# Patient Record
Sex: Female | Born: 1980 | Race: Black or African American | Hispanic: No | Smoking: Never smoker
Health system: Southern US, Community
[De-identification: ages and names within clinical notes are randomized; demographics above are authoritative.]

## PROBLEM LIST (undated history)

## (undated) DIAGNOSIS — E58 Dietary calcium deficiency: Secondary | ICD-10-CM

## (undated) DIAGNOSIS — D649 Anemia, unspecified: Secondary | ICD-10-CM

## (undated) HISTORY — PX: BARIATRIC SURGERY: SHX1103

## (undated) HISTORY — DX: Anemia, unspecified: D64.9

## (undated) HISTORY — DX: Dietary calcium deficiency: E58

---

## 2017-01-09 ENCOUNTER — Encounter: Payer: Self-pay | Admitting: Family Medicine

## 2017-01-09 ENCOUNTER — Ambulatory Visit (INDEPENDENT_AMBULATORY_CARE_PROVIDER_SITE_OTHER): Payer: BC Managed Care – PPO | Admitting: Family Medicine

## 2017-01-09 VITALS — BP 106/64 | HR 84 | Temp 98.7°F | Ht 63.0 in | Wt 137.0 lb

## 2017-01-09 DIAGNOSIS — Z9884 Bariatric surgery status: Secondary | ICD-10-CM

## 2017-01-09 DIAGNOSIS — Z862 Personal history of diseases of the blood and blood-forming organs and certain disorders involving the immune mechanism: Secondary | ICD-10-CM

## 2017-01-09 DIAGNOSIS — D509 Iron deficiency anemia, unspecified: Secondary | ICD-10-CM

## 2017-01-09 DIAGNOSIS — Z9889 Other specified postprocedural states: Secondary | ICD-10-CM | POA: Diagnosis not present

## 2017-01-09 DIAGNOSIS — Z114 Encounter for screening for human immunodeficiency virus [HIV]: Secondary | ICD-10-CM

## 2017-01-09 NOTE — Progress Notes (Signed)
   Subjective:   Patient ID: Leah Solis    DOB: [birth year], 36 y.o. female   MRN: [id number]  CC: new patient visit   HPI: Leah Solis is a 36 y.o. female who presents to clinic today for establishing care with PCP.  Patient moved here from [place], [place] in January 2018.  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  Also works full time at [employer] in [place].  Attends school in [employer] Studies criminal justice.  Enjoys work at [employer] because gets to work with patients who have psych illnesses and she feels that is similar to working with Engineer, structural.  Divorced x 3 years.  Has three children, all girls - aged 13, 17, 5.    History of anemia Patient reports iron was low previously and she was taking iron pills.  Does not take them anymore because they constipate her.  Takes them when she feels tired. Has been feeling more tired lately and isn't sure if this is because of her iron.    Hx of gastric bypass Bypass in 2015 [place], [place]) by GI surgeon.  Has not gained any of her weight back.  Weight 268>137 lbs.  Eats several small meals a day.  Electrolytes were stable previously but she is concerned today whether electrolytes have remained normal.   Surgical Hx: C-section x1 in 2000, gastric bypass in 2015, GB removal 2015.  All teeth removed, now has dentures.  Meds: none, sometimes Iron and MV but the MV she is supposed to be taking after bypass is expensive   FHx: aunt has DM, mom with HLD and HTN, depression, ?breast ca Social: Never smoker, occasional THC when younger, socially etoh before but after gastric bypass not as much.  Lives by herself, daughters are adults and have moved out of the house.   Sexually active with bf.  No recent history of STDs, long time ago had BV and trichomonas in 2006/2007, treated.   ROS: Denies fevers, chills, nausea, vomiting, diarrhea.   Denies abdominal pain, shortness of breath.  Smoking status  reviewed. Patient is a never smoker.  Medications reviewed. Objective:   BP 106/64   Pulse 84   Temp 98.7 F (37.1 C) (Oral)   Ht  (1.6 m)   Wt 137 lb (62.1 kg)   LMP 01/02/2017   SpO2 99%   BMI 24.27 kg/m  Vitals and nursing note reviewed.  General: 37 yo F, well nourished, in no acute distress HEENT: NCAT, MMM, o/p clear  Neck: supple, nontender CV: RRR no MRG  Lungs: CTA B/L  Abdomen: soft, non-tender,+bs Skin: warm, dry Extremities: warm and well perfused, normal tone  Assessment & Plan:   History of iron deficiency anemia Per patient report, history of iron deficiency anemia.  Not currently taking iron as it constipates her.  Reports feeling tired lately.  -Check CBC, iron and TIBC today  HM: Pap in Oct 2017 - normal  -HIV screen today  Orders Placed This Encounter  Procedures  . Basic metabolic panel  . CBC  . Iron and TIBC  . HIV antibody    Freddrick March, MD [facility], PGY-1 01/14/2017 9:44 PM

## 2017-01-09 NOTE — Patient Instructions (Signed)
It was really nice meeting you today! You were seen in clinic to establish care with me as your PCP.  We reviewed your medical history and medications as well as addressed some health maintenance.  I obtained some bloodwork today to check your electrolytes and iron levels as you have been feeling tired lately.  In addition, I ordered an HIV screen as part of your health maintenance.   You can call clinic with any questions and follow up with me as needed.  I will call you once I have the results of your bloodwork.   Be well,  Freddrick March, MD

## 2017-01-10 LAB — BASIC METABOLIC PANEL
BUN/Creatinine Ratio: 15 (ref 9–23)
BUN: 9 mg/dL (ref 6–20)
CALCIUM: 8.7 mg/dL (ref 8.7–10.2)
CHLORIDE: 101 mmol/L (ref 96–106)
CO2: 26 mmol/L (ref 18–29)
CREATININE: 0.62 mg/dL (ref 0.57–1.00)
GFR calc Af Amer: 133 mL/min/{1.73_m2} (ref >59)
GFR calc non Af Amer: 116 mL/min/{1.73_m2} (ref >59)
Glucose: 75 mg/dL (ref 65–99)
POTASSIUM: 3.9 mmol/L (ref 3.5–5.2)
Sodium: 141 mmol/L (ref 134–144)

## 2017-01-10 LAB — CBC
HEMOGLOBIN: 7.4 g/dL — AB (ref 11.1–15.9)
Hematocrit: 25.7 % — ABNORMAL LOW (ref 34.0–46.6)
MCH: 17.5 pg — ABNORMAL LOW (ref 26.6–33.0)
MCHC: 28.8 g/dL — ABNORMAL LOW (ref 31.5–35.7)
MCV: 61 fL — ABNORMAL LOW (ref 79–97)
Platelets: 506 10*3/uL — ABNORMAL HIGH (ref 150–379)
RBC: 4.23 x10E6/uL (ref 3.77–5.28)
RDW: 18.7 % — ABNORMAL HIGH (ref 12.3–15.4)
WBC: 6.7 10*3/uL (ref 3.4–10.8)

## 2017-01-10 LAB — IRON AND TIBC
IRON SATURATION: 2 % — AB (ref 15–55)
Iron: 10 ug/dL — ABNORMAL LOW (ref 27–159)
TIBC: 426 ug/dL (ref 250–450)
UIBC: 416 ug/dL (ref 131–425)

## 2017-01-10 LAB — HIV ANTIBODY (ROUTINE TESTING W REFLEX): HIV SCREEN 4TH GENERATION: NONREACTIVE

## 2017-01-14 DIAGNOSIS — Z862 Personal history of diseases of the blood and blood-forming organs and certain disorders involving the immune mechanism: Secondary | ICD-10-CM | POA: Insufficient documentation

## 2017-01-14 DIAGNOSIS — Z9884 Bariatric surgery status: Secondary | ICD-10-CM | POA: Insufficient documentation

## 2017-01-14 NOTE — Assessment & Plan Note (Signed)
Per patient report, history of iron deficiency anemia.  Not currently taking iron as it constipates her.  Reports feeling tired lately.  -Check CBC, iron and TIBC today

## 2017-01-16 ENCOUNTER — Telehealth: Payer: Self-pay | Admitting: Family Medicine

## 2017-01-16 ENCOUNTER — Other Ambulatory Visit: Payer: Self-pay | Admitting: Family Medicine

## 2017-01-16 MED ORDER — DOCUSATE SODIUM 100 MG PO CAPS: 100.0000 mg | ORAL_CAPSULE | Freq: Every day | ORAL | 2 refills | Status: DC

## 2017-01-16 MED ORDER — FERROUS GLUCONATE 324 (38 FE) MG PO TABS: 324.0000 mg | ORAL_TABLET | Freq: Two times a day (BID) | ORAL | 2 refills | Status: DC

## 2017-01-16 NOTE — Telephone Encounter (Signed)
Discussed low iron sat with patient and recommended starting iron supplement.  Have called in ferrous gluconate to her pharmacy to be taken BID with meals.  Also prescribed Colace prn for constipation as she has previously not been taking iron due to this problem.  All questions answered.

## 2017-05-08 ENCOUNTER — Encounter: Payer: Self-pay | Admitting: Family Medicine

## 2017-05-08 ENCOUNTER — Ambulatory Visit (INDEPENDENT_AMBULATORY_CARE_PROVIDER_SITE_OTHER): Payer: BC Managed Care – PPO | Admitting: Family Medicine

## 2017-05-08 VITALS — BP 100/64 | HR 70 | Temp 98.3°F | Wt 136.0 lb

## 2017-05-08 DIAGNOSIS — F419 Anxiety disorder, unspecified: Secondary | ICD-10-CM | POA: Diagnosis not present

## 2017-05-08 MED ORDER — HYDROXYZINE HCL 25 MG PO TABS: 25.0000 mg | ORAL_TABLET | Freq: Three times a day (TID) | ORAL | 0 refills | Status: DC | PRN

## 2017-05-08 NOTE — Patient Instructions (Signed)
It was great seeing you today! We have addressed the following issues today  1. Starting on a medication called Atarax start at 25 mg and see how you tolerate the sedating effect. You can increase to 50 mg (2 pills) if you feel that it is not helping as long as you are not too drowsy 2. Use relaxation technique to help with your anxiety attack. 3. Follow up with PCP if there is no change  If we did any lab work today, and the results require attention, either me or my nurse will get in touch with you. If everything is normal, you will get a letter in mail and a message via . If you don't hear from us in two weeks, please give us a call. Otherwise, we look forward to seeing you again at your next visit. If you have any questions or concerns before then, please call the clinic at [phone].  Please bring all your medications to every doctors visit  Sign up for My Chart to have easy access to your labs results, and communication with your Primary care physician. Please ask Front Desk for some assistance.   Please check-out at the front desk before leaving the clinic.    Take Care,   Dr. Sydnee Cabaliallo

## 2017-05-08 NOTE — Progress Notes (Signed)
   Subjective:    Patient ID: Leah Solis, female    DOB: Jun 22, 1981, 36 y.o.   MRN: [id number]   CC: Anxiety  HPI: Patient isDollene Solis a 36 yo female with no significant past medical history who presents today complaining of anxiety. Patient reports she has had two episodes in the past month. Patient works at [employer] in [place], [place] and reports that job is very stressful in addition to being in school for [employer] justice. Patient reports palpitations, diaphoresis and mild tremors during anxiety attack. She had to take a few minutes to calm herself down but then felt better. Patient reports similar anxiety episodes two years ago and was briefly on Xanax with complete resolution of her symptoms after a few episodes. Patient denies any chest pain, shortness of breath, abdominal pain, nausea, vomiting, headaches, dizziness or vision changes.   Smoking status reviewed   ROS: all other systems were reviewed and are negative other than in the HPI   No past medical history on file.  No past surgical history on file.  Past medical history, surgical, family, and social history reviewed and updated in the EMR as appropriate.  Objective:  BP 100/64   Pulse 70   Temp 98.3 F (36.8 C) (Oral)   Wt 136 lb (61.7 kg)   LMP 04/21/2017   SpO2 99%   BMI 24.09 kg/m   Vitals and nursing note reviewed  General: NAD, pleasant, able to participate in exam Cardiac: RRR, normal heart sounds, no murmurs. 2+ radial and PT pulses bilaterally Respiratory: CTAB, normal effort, No wheezes, rales or rhonchi Abdomen: soft, nontender, nondistended, no hepatic or splenomegaly, +BS Extremities: no edema or cyanosis. WWP. Skin: warm and dry, no rashes noted Neuro: alert and oriented x4, no focal deficits Psych: Normal affect and mood   Assessment & Plan:   #Anxiety attack, acute, prior history Patient presented with symptoms suspicious for anxiety/panick attack. GAD score of 12. Patient able to  control symptoms with deep breathing and relaxation technique. Patient has a stressful job in addition to a busy social life which has been weighing on her. Patient with similar episodes two years ago and was started on xanax. Patient is not requesting any benzo but would like any agent that she could use to help her manage any future attack. --Order Atarax 25 mg tid as needed, discuss sedation and titrating dosage to 50 mg as needed/tolerated. --Relaxation technique handout given --Follow up with PCP if symptoms do not improve or worsens with current treatment plan. Would consider CBT and behavorial health referral in the future as needed.   Lovena NeighboursAbdoulaye Honestee Revard, MD [facility] PGY-2

## 2017-07-05 ENCOUNTER — Encounter: Payer: Self-pay | Admitting: Family Medicine

## 2017-07-05 ENCOUNTER — Ambulatory Visit (INDEPENDENT_AMBULATORY_CARE_PROVIDER_SITE_OTHER): Payer: BC Managed Care – PPO | Admitting: Family Medicine

## 2017-07-05 VITALS — BP 100/68 | HR 73 | Temp 98.2°F | Ht 63.0 in | Wt 134.0 lb

## 2017-07-05 DIAGNOSIS — R300 Dysuria: Secondary | ICD-10-CM | POA: Diagnosis not present

## 2017-07-05 DIAGNOSIS — D509 Iron deficiency anemia, unspecified: Secondary | ICD-10-CM | POA: Diagnosis not present

## 2017-07-05 DIAGNOSIS — Z862 Personal history of diseases of the blood and blood-forming organs and certain disorders involving the immune mechanism: Secondary | ICD-10-CM

## 2017-07-05 DIAGNOSIS — R8290 Unspecified abnormal findings in urine: Secondary | ICD-10-CM

## 2017-07-05 LAB — POCT URINALYSIS DIP (MANUAL ENTRY)
BILIRUBIN UA: NEGATIVE
Blood, UA: NEGATIVE
Glucose, UA: NEGATIVE mg/dL
Ketones, POC UA: NEGATIVE mg/dL
Nitrite, UA: NEGATIVE
Protein Ur, POC: NEGATIVE mg/dL
Spec Grav, UA: >=1.03 — AB (ref 1.010–1.025)
UROBILINOGEN UA: 0.2 U/dL
pH, UA: 5.5 (ref 5.0–8.0)

## 2017-07-05 LAB — POCT UA - MICROSCOPIC ONLY

## 2017-07-05 NOTE — Assessment & Plan Note (Signed)
Reports feeling less tired today.  -Will recheck CBC and iron panel today

## 2017-07-05 NOTE — Progress Notes (Signed)
   Subjective:   Patient ID: Leah Solis    DOB: Apr 08, 1981, 36 y.o. female   MRN: [id number]  CC: foul odor in urine   HPI: Leah Solis is a 36 y.o. female who presents to clinic today for urine odor.  Urine odor -Urine is very yellow in color but not dark.  Has not noted any blood in urine.   -Denies burning with urination, denies frequency.  Reports some urgency but thinks this may be because she tried holding her urine when at work and then has urge to go all of a sudden.   -Has not tried any new foods recently such as asparagus.    -Feels that she does not drink enough water   -Feels thirsty quite a lot and will eat ice as she has history of iron deficiency anemia  -Denies fevers, chills, nausea, vomiting.  Denies abdominal pain.    Anemia -continues to take iron supplement as prescribed  -reports she is feeling a little better and less tired as of late   ROS: See HPI for pertinent ROS. PMFSH: Pertinent past medical, surgical, family, and social history were reviewed and updated as appropriate. Smoking status reviewed. Medications reviewed.  Objective:   BP 100/68   Pulse 73   Temp 98.2 F (36.8 C) (Oral)   Ht  (1.6 m)   Wt 134 lb (60.8 kg)   LMP 06/21/2017   SpO2 98%   BMI 23.74 kg/m  Vitals and nursing note reviewed.  General: 36 yo female, NAD  HEENT: NCAT, MMM, o/p clear  Neck: supple, non-tender CV: RRR no MRG  Lungs: CTAB, non-labored  Abdomen: soft, NTND, +bs  Skin: warm, dry  Extremities: warm and well perfused   Assessment & Plan:   History of iron deficiency anemia Reports feeling less tired today.  -Will recheck CBC and iron panel today   Malodorous urine UA today in clinic without nitrites, trace leuks - without suspicion for infectious etiology and no red flags on exam. High spec grav indicating a concentrated urine -Recommend increase water intake, pt agreeable  -return precautions discussed  Orders Placed This Encounter    Procedures  . CBC  . Iron and TIBC  . POCT urinalysis dipstick  . POCT UA - Microscopic Only   Follow-up: 3 months   Freddrick March, MD [facility], PGY-2 07/05/2017 8:59 PM

## 2017-07-05 NOTE — Assessment & Plan Note (Addendum)
UA today in clinic without nitrites, trace leuks - without suspicion for infectious etiology and no red flags on exam. High spec grav indicating a concentrated urine -Recommend increase water intake, pt agreeable  -return precautions discussed

## 2017-07-05 NOTE — Patient Instructions (Signed)
It was nice seeing you again today!  You were seen in clinic for foul odor in your urine.  We checked it and it does not appear to be infected.  However it does show that it is concentrated and this is most likely from you not drinking enough water as you mentioned.  I would recommend 8-12 glasses of water a day to stay well-hydrated and I expect your symptoms to improve.    Today we also checked your bloodwork to make sure your iron saturation and hemoglobin is looking better.  Please continue to take the iron pills as you have been doing and I will call you with these results as well once I have them  Be well, Freddrick March, MD

## 2017-07-06 LAB — CBC
HEMOGLOBIN: 7.8 g/dL — AB (ref 11.1–15.9)
Hematocrit: 28.3 % — ABNORMAL LOW (ref 34.0–46.6)
MCH: 17.8 pg — ABNORMAL LOW (ref 26.6–33.0)
MCHC: 27.6 g/dL — ABNORMAL LOW (ref 31.5–35.7)
MCV: 65 fL — ABNORMAL LOW (ref 79–97)
Platelets: 644 10*3/uL — ABNORMAL HIGH (ref 150–379)
RBC: 4.39 x10E6/uL (ref 3.77–5.28)
RDW: 18.8 % — AB (ref 12.3–15.4)
WBC: 5.2 10*3/uL (ref 3.4–10.8)

## 2017-07-06 LAB — IRON AND TIBC
IRON SATURATION: 3 % — AB (ref 15–55)
Iron: 12 ug/dL — ABNORMAL LOW (ref 27–159)
TIBC: 467 ug/dL — AB (ref 250–450)
UIBC: 455 ug/dL — AB (ref 131–425)

## 2017-07-08 ENCOUNTER — Telehealth: Payer: Self-pay | Admitting: *Deleted

## 2017-07-08 NOTE — Telephone Encounter (Signed)
Patient  Called in asking if FMLA paperwork has been completed. States she brought paperwork with her to [place] on 07/05/2017. Please call patient when completed. Kinnie Feil, RN, BSN

## 2017-07-09 NOTE — Telephone Encounter (Signed)
Not ready yet, please inform patient we have 7 days to complete forms.

## 2017-07-16 NOTE — Telephone Encounter (Signed)
Pt called again about her FMLA paperwork. She is wanting to get this form faxed to [phone]  Attn: Mancel Parsons .  Please let her know when this is done.  She is in class so a message can be left on her phone

## 2017-07-16 NOTE — Telephone Encounter (Signed)
Attempted to call patient regarding details of the form - left VM.  Her section has not even been filled out to indicate the reason she wants FMLA (intermittent vs longer absence). Will need a lot more information from her in order to fill out that in addition to short-term disability.  I was unable to get a hold of her today as she is likely in class however since I have only seen her for dysuria, please have her come in to be seen and have this filled out, thanks.

## 2017-07-16 NOTE — Telephone Encounter (Signed)
Pt is still in class but is on break until 3:30.  She will answer the call when dr calls back. Pt stated she was in the office sept 28 to discuss this with dr.  Rock Nephew doesn't feel she should have to come by for another appt

## 2017-07-17 NOTE — Telephone Encounter (Signed)
Patient informed of message from MD, patient states she is out of town for class and wont be able to come into office anytime soon. Patient upset because she was not told she needed an appointment when she left the paperwork but was instead told we have 7 days to complete forms and the forms are due by the end of this week. Patient requesting to speak with MD today.

## 2017-07-17 NOTE — Telephone Encounter (Signed)
Patient was seen on 9/28 for foul smelling urine and gave me the FMLA paperwork at end of visit so it was not discussed.  Please call her and ask her schedule an appointment thank you.

## 2017-07-18 NOTE — Telephone Encounter (Signed)
Have called patient to discuss symptoms.  Will fax the form to her tomorrow to have her portion filled out before she sends it in.

## 2017-08-21 ENCOUNTER — Telehealth: Payer: Self-pay | Admitting: Family Medicine

## 2017-08-21 NOTE — Telephone Encounter (Signed)
Pt called and said she has a yeast infection she cant get rid of. She has used monostat, but it didn't work fEngineer, materialsor her. She would [facility] Rx [facility] in [place] [address]. Please advise. If any questions call her mobile number in her chart.

## 2017-08-22 NOTE — Telephone Encounter (Signed)
Please inform patient she will need to come in and be evaluated first with wet prep before we can prescribe any medication to make sure we are treating the correct thing.

## 2017-08-23 ENCOUNTER — Ambulatory Visit: Payer: BC Managed Care – PPO | Admitting: Family Medicine

## 2017-08-23 ENCOUNTER — Encounter: Payer: Self-pay | Admitting: Family Medicine

## 2017-08-23 ENCOUNTER — Other Ambulatory Visit: Payer: Self-pay

## 2017-08-23 ENCOUNTER — Other Ambulatory Visit (HOSPITAL_COMMUNITY)
Admission: RE | Admit: 2017-08-23 | Discharge: 2017-08-23 | Disposition: A | Discharge status: Home / Self Care | Payer: BC Managed Care – PPO | Source: Ambulatory Visit | Attending: Family Medicine | Admitting: Family Medicine

## 2017-08-23 VITALS — BP 98/58 | HR 96 | Temp 98.3°F | Wt 128.0 lb

## 2017-08-23 DIAGNOSIS — N760 Acute vaginitis: Secondary | ICD-10-CM

## 2017-08-23 DIAGNOSIS — Z124 Encounter for screening for malignant neoplasm of cervix: Secondary | ICD-10-CM | POA: Insufficient documentation

## 2017-08-23 DIAGNOSIS — B9689 Other specified bacterial agents as the cause of diseases classified elsewhere: Secondary | ICD-10-CM | POA: Diagnosis not present

## 2017-08-23 DIAGNOSIS — N898 Other specified noninflammatory disorders of vagina: Secondary | ICD-10-CM | POA: Diagnosis not present

## 2017-08-23 LAB — POCT WET PREP (WET MOUNT)
CLUE CELLS WET PREP WHIFF POC: NEGATIVE
Trichomonas Wet Prep HPF POC: ABSENT

## 2017-08-23 MED ORDER — METRONIDAZOLE 500 MG PO TABS: 500.0000 mg | ORAL_TABLET | Freq: Two times a day (BID) | ORAL | 0 refills | Status: AC

## 2017-08-23 MED ORDER — FLUCONAZOLE 150 MG PO TABS: 150.0000 mg | ORAL_TABLET | Freq: Once | ORAL | 0 refills | Status: AC

## 2017-08-23 NOTE — Assessment & Plan Note (Signed)
Patient had opted to have us call with results so was given rx for diflucan given her symptoms. Wet prep showed clue cell c/w BV so patient was called and informed and told not to treat with diflucan but to take metronidazole for 7 days. All questions were answered and she voiced good understanding.

## 2017-08-23 NOTE — Patient Instructions (Signed)
It was good to see you today!  For your yeast infection - Take 1 diflucan today, if no improvement on day 3 then take the other pill  We are checking some labs today. If results require attention, either myself or my nurse will get in touch with you. If everything is normal, you will get a letter in the mail or a message in My Chart. Please give Leah Solis a call if you do not hear from Leah Solis after 2 weeks.   Please bring all of your medications with you to each visit.   Sign up for My Chart to have easy access to your labs results, and communication with your primary care physician.  Feel free to call with any questions or concerns at any time, at [phone].   Take care,  Dr. Leland HerElsia J Arlando Leisinger, DO [facility]

## 2017-08-23 NOTE — Progress Notes (Signed)
    Subjective:  Leah Solis is a 36 y.o. female who [facility] with a chief complaint of vaginal itching.   HPI:  Vaginal itching - for the last 10days, similar to yeast infections she has had in the past with whitish clumpy discharge - used 7 day OTC monistat cream without any relief - no urinary symptoms - wants STD testing today  - no abdominal pain or nausea or vomiting - just started her period recently and is still on it today  ROS: Per HPI  Objective:  Physical Exam: BP (!) 98/58   Pulse 96   Temp 98.3 F (36.8 C) (Oral)   Wt 128 lb (58.1 kg)   LMP 08/17/2017   SpO2 99%   BMI 22.67 kg/m   Gen: NAD, resting comfortably GI: Normal bowel sounds present. Soft, Nontender, Nondistended. Pelvic exam: normal external genitalia, vulva, vagina, cervix, uterus and adnexa. Minimal dark blood in posterior fornix from os. MSK: no edema, cyanosis, or clubbing noted Skin: warm, dry Neuro: grossly normal, moves all extremities Psych: Normal affect and thought content  Results for orders placed or performed in visit on 08/23/17 (from the past 72 hour(s))  POCT Wet Prep Mellody Drown(Wet [place])     Status: Abnormal   Collection Time: 08/23/17 11:20 AM  Result Value Ref Range   Source Wet Prep POC VAG    WBC, Wet Prep HPF POC 0-3    Bacteria Wet Prep HPF POC Moderate (A) Few   Clue Cells Wet Prep HPF POC Few (A) None   Clue Cells Wet Prep Whiff POC Negative Whiff    Yeast Wet Prep HPF POC None    Trichomonas Wet Prep HPF POC Absent Absent     Assessment/Plan:  Bacterial vaginosis Patient had opted to have us call with results so was given rx for diflucan given her symptoms. Wet prep showed clue cell c/w BV so patient was called and informed and told not to treat with diflucan but to take metronidazole for 7 days. All questions were answered and she voiced good understanding.  STD testing - Pap, GC/GT, HIV, RPR collected today  Leland HerElsia J Jessicah Croll, DO PGY-2,   Family Medicine 08/23/2017 11:24 AM

## 2017-08-24 LAB — RPR: RPR: NONREACTIVE

## 2017-08-24 LAB — HIV ANTIBODY (ROUTINE TESTING W REFLEX): HIV SCREEN 4TH GENERATION: NONREACTIVE

## 2017-08-27 LAB — CYTOLOGY - PAP
CHLAMYDIA, DNA PROBE: NEGATIVE
DIAGNOSIS: NEGATIVE
HPV: NOT DETECTED
Neisseria Gonorrhea: NEGATIVE

## 2019-02-25 DIAGNOSIS — K140 Glossitis: Secondary | ICD-10-CM | POA: Insufficient documentation

## 2019-03-04 DIAGNOSIS — R768 Other specified abnormal immunological findings in serum: Secondary | ICD-10-CM | POA: Insufficient documentation

## 2019-03-10 NOTE — Progress Notes (Signed)
[facility]  [address], [address] [place], [place] [address] Phone: [phone]  Fax: [phone]   Clinic Day:  03/11/2019  Referring physician: Su Monks, PA  Chief Complaint: Leah Solis is a 38 y.o. female s/p gastric bypass with iron deficiency anemia who is referred in consultation with Su Monks, PA for assessment and management.   HPI: The patient was diagnosed with iron deficiency anemia since she began having children about 20 years ago, but it worsened following her gastric bypass surgery. She underwent gastric bypass surgery in [place], [place] in 2015.  She subsequently  lost 157 lbs and has kept it off since. She was started on oral iron pills, but stopped due to constipation. She takes them only when she is fatigued, about 2x weekly.    She established care with a new PCP, Leah Havers, PA-C, on 02/25/2019 after 2 years without seeing a doctor due to insurance issues. She reported glossitis, worse when eating or drinking.  CBC revealed a hemoglobin of 6.1; she was sent to the ED at [facility] for a transfusion. She received 1 unit PRBCs.   CBCs have been followed: 01/09/2017:  Hematocrit 25.7, hemoglobin 7.4, MCV 61, platelets 506,000, WBC 6700. 07/05/2017:  Hematocrit 28.3, hemoglobin 7.8, MCV 56, platelets 644,000, WBC 5200. 02/25/2019:  Hematocrit 23.6, hemoglobin 6.0, MCV 56, platelets 451,000, WBC 6400 (ANC 3400).    Additional labs on 02/25/2019 included a creatinine 0.6.  Iron saturation was 2% with a TIBC of 529.  B12 was 384.  Folate was 14.7.  Symptomatically, she is doing well. She denies any blood in her stool or black stools or blood in her urine. She has not menstruated in 2-3 months. She has occasional numbness in her fingers. They are occasionally cold.  She has very low energy levels, and is occasionally dizzy or lightheaded when she wakes up. She has shortness of breath on exertion, which she attributed to  being out of shape. She denies any fevers or sweats. She had a runny nose yesterday that she attributes to allergies. She reports foul smelling urine this morning.   She currently takes a multivitamin. She takes oral iron occasionally, but reports constipation. She denies any nausea, vomiting, or diarrhea.   She eats "whatever she wants." She has craved squash and zucchini since her blood transfusion. She has ice pica. She has difficulty drinking a lot of water due to her surgery.   Her great uncle had colon cancer, but she denies any other history of blood disorders or cancer.    Past Medical History:  Diagnosis Date  . Anemia   . Calcium deficiency     Past Surgical History:  Procedure Laterality Date  . BARIATRIC SURGERY    . CESAREAN SECTION      History reviewed. No pertinent family history.  Social History:  reports that she has never smoked. She has never used smokeless tobacco. No history on file for alcohol and drug. She does not smoke or drink. She denies any known exposure to radiation or toxins. She lives in [place], [place]. 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  Allergies: No Known Allergies  Current Medications: Current Outpatient Medications  Medication Sig Dispense Refill  . ferrous gluconate (FERGON) 324 MG tablet Take 1 tablet (324 mg total) by mouth 2 (two) times daily with a meal. 60 tablet 2  . Vitamin D, Ergocalciferol, (DRISDOL) 1.25 MG (50000 UT) CAPS capsule     .  docusate sodium (COLACE) 100 MG capsule Take 1 capsule (100 mg total) by mouth daily. (Patient not taking: Reported on 03/11/2019) 30 capsule 2  . hydrOXYzine (ATARAX/VISTARIL) 25 MG tablet Take 1 tablet (25 mg total) by mouth 3 (three) times daily as needed. (Patient not taking: Reported on 03/11/2019) 30 tablet 0   No current facility-administered medications for this visit.     Review of Systems  Constitutional: Positive for malaise/fatigue (intermittent) and  weight loss (157 lbs followed bypass surgery). Negative for chills, diaphoresis and fever.  HENT: Negative for congestion, hearing loss, sinus pain and sore throat.   Eyes: Negative for blurred vision.  Respiratory: Positive for shortness of breath (on exertion). Negative for cough, sputum production and wheezing.   Cardiovascular: Negative for chest pain, palpitations, orthopnea, leg swelling and PND.  Gastrointestinal: Positive for constipation (with oral iron). Negative for abdominal pain, blood in stool, diarrhea, heartburn, melena, nausea and vomiting.       Ice pica.  Genitourinary: Negative for dysuria, frequency and urgency.       Foul smelling urine.   Musculoskeletal: Negative for back pain, joint pain and myalgias.  Skin: Negative for rash.  Neurological: Positive for dizziness (lightheadedness upon waking up), tingling (in fingers) and sensory change (numbness in fingers). Negative for weakness and headaches.  Endo/Heme/Allergies: Positive for environmental allergies (rhinorrea). Does not bruise/bleed easily.  Psychiatric/Behavioral: Negative for depression and memory loss. The patient is not nervous/anxious and does not have insomnia.   All other systems reviewed and are negative.  Performance status (ECOG): 1  Blood pressure 121/83, pulse 80, temperature 98.3 F (36.8 C), temperature source Tympanic, resp. rate 18, height  (1.626 m), weight 132 lb 0.9 oz (59.9 kg), SpO2 100 %.   Physical Exam  Constitutional: She is oriented to person, place, and time. She appears well-developed and well-nourished. No distress.  HENT:  Head: Atraumatic.  Mouth/Throat: Oropharynx is clear and moist. No oropharyngeal exudate.  Dark braided hair. Mask.  Eyes: Pupils are equal, round, and reactive to light. Conjunctivae and EOM are normal. No scleral icterus.  Brown eyes.   Neck: Normal range of motion. Neck supple.  Cardiovascular: Normal rate, regular rhythm and normal heart sounds.   No murmur heard. Pulmonary/Chest: Effort normal and breath sounds normal. No respiratory distress. She has no wheezes.  Abdominal: Soft. Bowel sounds are normal. She exhibits no distension. There is no abdominal tenderness.  Musculoskeletal: Normal range of motion.        General: No edema.  Lymphadenopathy:    She has no cervical adenopathy.    She has no axillary adenopathy.       Right: No supraclavicular adenopathy present.       Left: No supraclavicular adenopathy present.  Neurological: She is alert and oriented to person, place, and time.  Skin: Skin is warm and dry. No rash noted. She is not diaphoretic. No erythema.  Psychiatric: She has a normal mood and affect. Her behavior is normal. Judgment and thought content normal.  Nursing note and vitals reviewed.   Office Visit on 03/11/2019  Component Date Value Ref Range Status  . Blood Bank Specimen 03/11/2019 SAMPLE AVAILABLE FOR TESTING   Final  . Sample Expiration 03/11/2019    Final                   Value:03/14/2019,2359 Performed at [facility], [address], [place], [place] [address]   . Iron 03/11/2019 10* 28 - 170 ug/dL Final  .  TIBC 03/11/2019 513* 250 - 450 ug/dL Final  . Saturation Ratios 03/11/2019 2* 10.4 - 31.8 % Final  . UIBC 03/11/2019 503  ug/dL Final   Performed at [facility], [address] [place], [place] [address]  . Ferritin 03/11/2019 5* 11 - 307 ng/mL Final   Performed at [facility], [address] [place], [place] [address]  . WBC 03/11/2019 8.5  4.0 - 10.5 K/uL Final  . RBC 03/11/2019 4.34  3.87 - 5.11 MIL/uL Final  . Hemoglobin 03/11/2019 7.4* 12.0 - 15.0 g/dL Final  . HCT 09/81/191406/12/2018 26.5* 36.0 - 46.0 % Final  . MCV 03/11/2019 61.1* 80.0 - 100.0 fL Final  . MCH 03/11/2019 17.1* 26.0 - 34.0 pg Final  . MCHC 03/11/2019 27.9* 30.0 - 36.0 g/dL Final  . RDW 78/29/562106/12/2018 28.1* 11.5 - 15.5 % Final  . Platelets 03/11/2019 776* 150 - 400 K/uL Final  . nRBC  03/11/2019 0.0  0.0 - 0.2 % Final  . Neutrophils Relative % 03/11/2019 47  % Final  . Neutro Abs 03/11/2019 4.1  1.7 - 7.7 K/uL Final  . Lymphocytes Relative 03/11/2019 38  % Final  . Lymphs Abs 03/11/2019 3.2  0.7 - 4.0 K/uL Final  . Monocytes Relative 03/11/2019 10  % Final  . Monocytes Absolute 03/11/2019 0.8  0.1 - 1.0 K/uL Final  . Eosinophils Relative 03/11/2019 4  % Final  . Eosinophils Absolute 03/11/2019 0.3  0.0 - 0.5 K/uL Final  . Basophils Relative 03/11/2019 1  % Final  . Basophils Absolute 03/11/2019 0.1  0.0 - 0.1 K/uL Final  . Immature Granulocytes 03/11/2019 0  % Final  . Abs Immature Granulocytes 03/11/2019 0.02  0.00 - 0.07 K/uL Final   Performed at [facility], [address] [place], [place] [address]  Appointment on 03/11/2019  Component Date Value Ref Range Status  . Preg Test, Ur 03/11/2019 NEGATIVE  NEGATIVE Final   Performed at [facility], [address] [place], [place] [address]    Assessment:  Dollene Primroseimeka S Anderson is a 38 y.o. female s/p gastric bypass surgery (2015) with iron deficiency anemia.  She describes a history of iron deficiency for at least 20 years.  She denies menses x 2-3 months.  Oral iron causes constipation.  She has ice pica.    CBC on 02/25/2019 revealed a hematocrit 23.6, hemoglobin 6.0, MCV 56, platelets 451,000, and WBC 6400 (ANC 3400).  Creatinine 0.6.  Iron saturation was 2% with a TIBC of 529.  B12 was 384.  Folate was 14.7.  Symptomatically, she is fatigued.  Exam is unremarkable.  Plan: 1.   Labs today:  CBC with diff, ferritin, iron studies, hold tube. 2.   Iron deficiency anemia  Discuss diagnosis and management of iron deficiency.  Discuss iron absorption problematic s/p gastric bypass.  Discuss consideration of IV iron.  Potential side effects reviewed.  Preauth Venofer-completed.  Venofer today. 3.   RTC weekly x 3 for Venofer. 4.   RTC in 6 weeks and 3 month for labs (CBC with diff, ferritin).  5.   RTC in 6 months for labs (CBC with diff, ferritin, B12, folate- day before), and +/- Venofer.  I discussed the assessment and treatment plan with the patient.  The patient was provided an opportunity to ask questions and all were answered.  The patient agreed with the plan and demonstrated an understanding of the instructions.  The patient was advised to call back if  the symptoms worsen or if the condition fails to improve as anticipated.   Melissa C. Merlene Pulling, MD, PhD    03/11/2019, 3:11 PM  I, Molly Dorshimer, am acting as Neurosurgeon for [employer]. Merlene Pulling, MD, PhD.  I, Melissa C. Merlene Pulling, MD, have reviewed the above documentation for accuracy and completeness, and I agree with the above.

## 2019-03-11 ENCOUNTER — Inpatient Hospital Stay: Payer: Managed Care, Other (non HMO)

## 2019-03-11 ENCOUNTER — Other Ambulatory Visit: Payer: Self-pay | Admitting: Hematology and Oncology

## 2019-03-11 ENCOUNTER — Other Ambulatory Visit: Payer: Self-pay

## 2019-03-11 ENCOUNTER — Encounter: Payer: Self-pay | Admitting: Hematology and Oncology

## 2019-03-11 ENCOUNTER — Inpatient Hospital Stay: Payer: Managed Care, Other (non HMO) | Attending: Hematology and Oncology | Admitting: Hematology and Oncology

## 2019-03-11 VITALS — BP 106/75 | HR 96 | Resp 18

## 2019-03-11 VITALS — BP 121/83 | HR 80 | Temp 98.3°F | Resp 18 | Ht 64.0 in | Wt 132.1 lb

## 2019-03-11 DIAGNOSIS — D509 Iron deficiency anemia, unspecified: Secondary | ICD-10-CM | POA: Diagnosis present

## 2019-03-11 DIAGNOSIS — K9589 Other complications of other bariatric procedure: Secondary | ICD-10-CM

## 2019-03-11 DIAGNOSIS — D508 Other iron deficiency anemias: Secondary | ICD-10-CM

## 2019-03-11 LAB — CBC WITH DIFFERENTIAL/PLATELET
Abs Immature Granulocytes: 0.02 10*3/uL (ref 0.00–0.07)
Basophils Absolute: 0.1 10*3/uL (ref 0.0–0.1)
Basophils Relative: 1 %
Eosinophils Absolute: 0.3 10*3/uL (ref 0.0–0.5)
Eosinophils Relative: 4 %
HCT: 26.5 % — ABNORMAL LOW (ref 36.0–46.0)
Hemoglobin: 7.4 g/dL — ABNORMAL LOW (ref 12.0–15.0)
Immature Granulocytes: 0 %
Lymphocytes Relative: 38 %
Lymphs Abs: 3.2 10*3/uL (ref 0.7–4.0)
MCH: 17.1 pg — ABNORMAL LOW (ref 26.0–34.0)
MCHC: 27.9 g/dL — ABNORMAL LOW (ref 30.0–36.0)
MCV: 61.1 fL — ABNORMAL LOW (ref 80.0–100.0)
Monocytes Absolute: 0.8 10*3/uL (ref 0.1–1.0)
Monocytes Relative: 10 %
Neutro Abs: 4.1 10*3/uL (ref 1.7–7.7)
Neutrophils Relative %: 47 %
Platelets: 776 10*3/uL — ABNORMAL HIGH (ref 150–400)
RBC: 4.34 MIL/uL (ref 3.87–5.11)
RDW: 28.1 % — ABNORMAL HIGH (ref 11.5–15.5)
WBC: 8.5 10*3/uL (ref 4.0–10.5)
nRBC: 0 % (ref 0.0–0.2)

## 2019-03-11 LAB — IRON AND TIBC
Iron: 10 ug/dL — ABNORMAL LOW (ref 28–170)
Saturation Ratios: 2 % — ABNORMAL LOW (ref 10.4–31.8)
TIBC: 513 ug/dL — ABNORMAL HIGH (ref 250–450)
UIBC: 503 ug/dL

## 2019-03-11 LAB — FERRITIN: Ferritin: 5 ng/mL — ABNORMAL LOW (ref 11–307)

## 2019-03-11 LAB — PREGNANCY, URINE: Preg Test, Ur: NEGATIVE

## 2019-03-11 LAB — SAMPLE TO BLOOD BANK

## 2019-03-11 MED ORDER — SODIUM CHLORIDE 0.9 % IV SOLN
Freq: Once | INTRAVENOUS | Status: AC
  Administered 2019-03-11: 16:00:00 via INTRAVENOUS
  Filled 2019-03-11: qty 250

## 2019-03-11 MED ORDER — SODIUM CHLORIDE 0.9 % IV SOLN: 200.0000 mg | Freq: Once | INTRAVENOUS | Status: DC

## 2019-03-11 MED ORDER — IRON SUCROSE 20 MG/ML IV SOLN
200.0000 mg | Freq: Once | INTRAVENOUS | Status: AC
  Administered 2019-03-11: 200 mg via INTRAVENOUS
  Filled 2019-03-11: qty 10

## 2019-03-11 NOTE — Patient Instructions (Signed)
Iron Sucrose injection What is this medicine? IRON SUCROSE (AHY ern SOO krohs) is an iron complex. Iron is used to make healthy red blood cells, which carry oxygen and nutrients throughout the body. This medicine is used to treat iron deficiency anemia in people with chronic kidney disease. This medicine may be used for other purposes; ask your health care provider or pharmacist if you have questions. COMMON BRAND NAME(S): Venofer What should I tell my health care provider before I take this medicine? They need to know if you have any of these conditions: -anemia not caused by low iron levels -heart disease -high levels of iron in the blood -kidney disease -liver disease -an unusual or allergic reaction to iron, other medicines, foods, dyes, or preservatives -pregnant or trying to get pregnant -breast-feeding How should I use this medicine? This medicine is for infusion into a vein. It is given by a health care professional in a hospital or clinic setting. Talk to your pediatrician regarding the use of this medicine in children. While this drug may be prescribed for children as young as 2 years for selected conditions, precautions do apply. Overdosage: If you think you have taken too much of this medicine contact a poison control center or emergency room at once. NOTE: This medicine is only for you. Do not share this medicine with others. What if I miss a dose? It is important not to miss your dose. Call your doctor or health care professional if you are unable to keep an appointment. What may interact with this medicine? Do not take this medicine with any of the following medications: -deferoxamine -dimercaprol -other iron products This medicine may also interact with the following medications: -chloramphenicol -deferasirox This list may not describe all possible interactions. Give your health care provider a list of all the medicines, herbs, non-prescription drugs, or dietary  supplements you use. Also tell them if you smoke, drink alcohol, or use illegal drugs. Some items may interact with your medicine. What should I watch for while using this medicine? Visit your doctor or healthcare professional regularly. Tell your doctor or healthcare professional if your symptoms do not start to get better or if they get worse. You may need blood work done while you are taking this medicine. You may need to follow a special diet. Talk to your doctor. Foods that contain iron include: whole grains/cereals, dried fruits, beans, or peas, leafy green vegetables, and organ meats (liver, kidney). What side effects may I notice from receiving this medicine? Side effects that you should report to your doctor or health care professional as soon as possible: -allergic reactions like skin rash, itching or hives, swelling of the face, lips, or tongue -breathing problems -changes in blood pressure -cough -fast, irregular heartbeat -feeling faint or lightheaded, falls -fever or chills -flushing, sweating, or hot feelings -joint or muscle aches/pains -seizures -swelling of the ankles or feet -unusually weak or tired Side effects that usually do not require medical attention (report to your doctor or health care professional if they continue or are bothersome): -diarrhea -feeling achy -headache -irritation at site where injected -nausea, vomiting -stomach upset -tiredness This list may not describe all possible side effects. Call your doctor for medical advice about side effects. You may report side effects to FDA at 1-800-FDA-1088. Where should I keep my medicine? This drug is given in a hospital or clinic and will not be stored at home. NOTE: This sheet is a summary. It may not cover all possible information. If   you have questions about this medicine, talk to your doctor, pharmacist, or health care provider.  2019 Elsevier/Gold Standard (2011-07-05 17:14:35)  

## 2019-03-18 ENCOUNTER — Other Ambulatory Visit: Payer: Self-pay

## 2019-03-18 ENCOUNTER — Inpatient Hospital Stay: Payer: Managed Care, Other (non HMO) | Attending: Hematology and Oncology

## 2019-03-18 VITALS — BP 101/65 | HR 83 | Resp 20

## 2019-03-18 DIAGNOSIS — K9589 Other complications of other bariatric procedure: Secondary | ICD-10-CM

## 2019-03-18 DIAGNOSIS — D509 Iron deficiency anemia, unspecified: Secondary | ICD-10-CM | POA: Diagnosis present

## 2019-03-18 MED ORDER — IRON SUCROSE 20 MG/ML IV SOLN
200.0000 mg | Freq: Once | INTRAVENOUS | Status: AC
  Administered 2019-03-18: 15:00:00 200 mg via INTRAVENOUS

## 2019-03-18 MED ORDER — SODIUM CHLORIDE 0.9 % IV SOLN
Freq: Once | INTRAVENOUS | Status: AC
  Administered 2019-03-18: 15:00:00 via INTRAVENOUS
  Filled 2019-03-18: qty 250

## 2019-03-18 NOTE — Patient Instructions (Signed)
Iron Sucrose injection What is this medicine? IRON SUCROSE (AHY ern SOO krohs) is an iron complex. Iron is used to make healthy red blood cells, which carry oxygen and nutrients throughout the body. This medicine is used to treat iron deficiency anemia in people with chronic kidney disease. This medicine may be used for other purposes; ask your health care provider or pharmacist if you have questions. COMMON BRAND NAME(S): Venofer What should I tell my health care provider before I take this medicine? They need to know if you have any of these conditions: -anemia not caused by low iron levels -heart disease -high levels of iron in the blood -kidney disease -liver disease -an unusual or allergic reaction to iron, other medicines, foods, dyes, or preservatives -pregnant or trying to get pregnant -breast-feeding How should I use this medicine? This medicine is for infusion into a vein. It is given by a health care professional in a hospital or clinic setting. Talk to your pediatrician regarding the use of this medicine in children. While this drug may be prescribed for children as young as 2 years for selected conditions, precautions do apply. Overdosage: If you think you have taken too much of this medicine contact a poison control center or emergency room at once. NOTE: This medicine is only for you. Do not share this medicine with others. What if I miss a dose? It is important not to miss your dose. Call your doctor or health care professional if you are unable to keep an appointment. What may interact with this medicine? Do not take this medicine with any of the following medications: -deferoxamine -dimercaprol -other iron products This medicine may also interact with the following medications: -chloramphenicol -deferasirox This list may not describe all possible interactions. Give your health care provider a list of all the medicines, herbs, non-prescription drugs, or dietary  supplements you use. Also tell them if you smoke, drink alcohol, or use illegal drugs. Some items may interact with your medicine. What should I watch for while using this medicine? Visit your doctor or healthcare professional regularly. Tell your doctor or healthcare professional if your symptoms do not start to get better or if they get worse. You may need blood work done while you are taking this medicine. You may need to follow a special diet. Talk to your doctor. Foods that contain iron include: whole grains/cereals, dried fruits, beans, or peas, leafy green vegetables, and organ meats (liver, kidney). What side effects may I notice from receiving this medicine? Side effects that you should report to your doctor or health care professional as soon as possible: -allergic reactions like skin rash, itching or hives, swelling of the face, lips, or tongue -breathing problems -changes in blood pressure -cough -fast, irregular heartbeat -feeling faint or lightheaded, falls -fever or chills -flushing, sweating, or hot feelings -joint or muscle aches/pains -seizures -swelling of the ankles or feet -unusually weak or tired Side effects that usually do not require medical attention (report to your doctor or health care professional if they continue or are bothersome): -diarrhea -feeling achy -headache -irritation at site where injected -nausea, vomiting -stomach upset -tiredness This list may not describe all possible side effects. Call your doctor for medical advice about side effects. You may report side effects to FDA at 1-800-FDA-1088. Where should I keep my medicine? This drug is given in a hospital or clinic and will not be stored at home. NOTE: This sheet is a summary. It may not cover all possible information. If   you have questions about this medicine, talk to your doctor, pharmacist, or health care provider.  2019 Elsevier/Gold Standard (2011-07-05 17:14:35)  

## 2019-03-24 ENCOUNTER — Other Ambulatory Visit: Payer: Self-pay

## 2019-03-25 ENCOUNTER — Inpatient Hospital Stay: Payer: Managed Care, Other (non HMO)

## 2019-03-25 VITALS — BP 93/59 | HR 76 | Temp 97.9°F | Resp 20

## 2019-03-25 DIAGNOSIS — K9589 Other complications of other bariatric procedure: Secondary | ICD-10-CM

## 2019-03-25 DIAGNOSIS — D508 Other iron deficiency anemias: Secondary | ICD-10-CM

## 2019-03-25 DIAGNOSIS — D509 Iron deficiency anemia, unspecified: Secondary | ICD-10-CM

## 2019-03-25 MED ORDER — IRON SUCROSE 20 MG/ML IV SOLN
200.0000 mg | Freq: Once | INTRAVENOUS | Status: AC
  Administered 2019-03-25: 200 mg via INTRAVENOUS

## 2019-03-25 MED ORDER — SODIUM CHLORIDE 0.9 % IV SOLN
Freq: Once | INTRAVENOUS | Status: AC
  Administered 2019-03-25: 15:00:00 via INTRAVENOUS
  Filled 2019-03-25: qty 250

## 2019-03-25 NOTE — Patient Instructions (Signed)
Iron Sucrose injection What is this medicine? IRON SUCROSE (AHY ern SOO krohs) is an iron complex. Iron is used to make healthy red blood cells, which carry oxygen and nutrients throughout the body. This medicine is used to treat iron deficiency anemia in people with chronic kidney disease. This medicine may be used for other purposes; ask your health care provider or pharmacist if you have questions. COMMON BRAND NAME(S): Venofer What should I tell my health care provider before I take this medicine? They need to know if you have any of these conditions: -anemia not caused by low iron levels -heart disease -high levels of iron in the blood -kidney disease -liver disease -an unusual or allergic reaction to iron, other medicines, foods, dyes, or preservatives -pregnant or trying to get pregnant -breast-feeding How should I use this medicine? This medicine is for infusion into a vein. It is given by a health care professional in a hospital or clinic setting. Talk to your pediatrician regarding the use of this medicine in children. While this drug may be prescribed for children as young as 2 years for selected conditions, precautions do apply. Overdosage: If you think you have taken too much of this medicine contact a poison control center or emergency room at once. NOTE: This medicine is only for you. Do not share this medicine with others. What if I miss a dose? It is important not to miss your dose. Call your doctor or health care professional if you are unable to keep an appointment. What may interact with this medicine? Do not take this medicine with any of the following medications: -deferoxamine -dimercaprol -other iron products This medicine may also interact with the following medications: -chloramphenicol -deferasirox This list may not describe all possible interactions. Give your health care provider a list of all the medicines, herbs, non-prescription drugs, or dietary  supplements you use. Also tell them if you smoke, drink alcohol, or use illegal drugs. Some items may interact with your medicine. What should I watch for while using this medicine? Visit your doctor or healthcare professional regularly. Tell your doctor or healthcare professional if your symptoms do not start to get better or if they get worse. You may need blood work done while you are taking this medicine. You may need to follow a special diet. Talk to your doctor. Foods that contain iron include: whole grains/cereals, dried fruits, beans, or peas, leafy green vegetables, and organ meats (liver, kidney). What side effects may I notice from receiving this medicine? Side effects that you should report to your doctor or health care professional as soon as possible: -allergic reactions like skin rash, itching or hives, swelling of the face, lips, or tongue -breathing problems -changes in blood pressure -cough -fast, irregular heartbeat -feeling faint or lightheaded, falls -fever or chills -flushing, sweating, or hot feelings -joint or muscle aches/pains -seizures -swelling of the ankles or feet -unusually weak or tired Side effects that usually do not require medical attention (report to your doctor or health care professional if they continue or are bothersome): -diarrhea -feeling achy -headache -irritation at site where injected -nausea, vomiting -stomach upset -tiredness This list may not describe all possible side effects. Call your doctor for medical advice about side effects. You may report side effects to FDA at 1-800-FDA-1088. Where should I keep my medicine? This drug is given in a hospital or clinic and will not be stored at home. NOTE: This sheet is a summary. It may not cover all possible information. If   you have questions about this medicine, talk to your doctor, pharmacist, or health care provider.  2019 Elsevier/Gold Standard (2011-07-05 17:14:35)  

## 2019-03-31 ENCOUNTER — Other Ambulatory Visit: Payer: Self-pay

## 2019-04-01 ENCOUNTER — Inpatient Hospital Stay: Payer: Managed Care, Other (non HMO)

## 2019-04-01 DIAGNOSIS — K9589 Other complications of other bariatric procedure: Secondary | ICD-10-CM

## 2019-04-01 DIAGNOSIS — D509 Iron deficiency anemia, unspecified: Secondary | ICD-10-CM | POA: Diagnosis not present

## 2019-04-01 MED ORDER — IRON SUCROSE 20 MG/ML IV SOLN
200.0000 mg | Freq: Once | INTRAVENOUS | Status: AC
  Administered 2019-04-01: 200 mg via INTRAVENOUS
  Filled 2019-04-01: qty 10

## 2019-04-01 MED ORDER — SODIUM CHLORIDE 0.9 % IV SOLN
Freq: Once | INTRAVENOUS | Status: AC
  Administered 2019-04-01: 15:00:00 via INTRAVENOUS
  Filled 2019-04-01: qty 250

## 2019-04-21 ENCOUNTER — Inpatient Hospital Stay: Payer: Managed Care, Other (non HMO) | Attending: Hematology and Oncology

## 2019-04-21 ENCOUNTER — Other Ambulatory Visit: Payer: Self-pay

## 2019-04-21 DIAGNOSIS — D509 Iron deficiency anemia, unspecified: Secondary | ICD-10-CM | POA: Diagnosis not present

## 2019-04-21 DIAGNOSIS — K9589 Other complications of other bariatric procedure: Secondary | ICD-10-CM

## 2019-04-21 LAB — CBC WITH DIFFERENTIAL/PLATELET
Abs Immature Granulocytes: 0.01 10*3/uL (ref 0.00–0.07)
Basophils Absolute: 0.1 10*3/uL (ref 0.0–0.1)
Basophils Relative: 1 %
Eosinophils Absolute: 0.3 10*3/uL (ref 0.0–0.5)
Eosinophils Relative: 5 %
HCT: 36.1 % (ref 36.0–46.0)
Hemoglobin: 10.9 g/dL — ABNORMAL LOW (ref 12.0–15.0)
Immature Granulocytes: 0 %
Lymphocytes Relative: 44 %
Lymphs Abs: 2.4 10*3/uL (ref 0.7–4.0)
MCH: 22.2 pg — ABNORMAL LOW (ref 26.0–34.0)
MCHC: 30.2 g/dL (ref 30.0–36.0)
MCV: 73.5 fL — ABNORMAL LOW (ref 80.0–100.0)
Monocytes Absolute: 0.5 10*3/uL (ref 0.1–1.0)
Monocytes Relative: 8 %
Neutro Abs: 2.3 10*3/uL (ref 1.7–7.7)
Neutrophils Relative %: 42 %
Platelets: 367 10*3/uL (ref 150–400)
RBC: 4.91 MIL/uL (ref 3.87–5.11)
RDW: 29.3 % — ABNORMAL HIGH (ref 11.5–15.5)
WBC: 5.5 10*3/uL (ref 4.0–10.5)
nRBC: 0 % (ref 0.0–0.2)

## 2019-04-21 LAB — FERRITIN: Ferritin: 53 ng/mL (ref 11–307)

## 2019-04-22 ENCOUNTER — Inpatient Hospital Stay: Payer: Managed Care, Other (non HMO)

## 2019-06-03 ENCOUNTER — Inpatient Hospital Stay: Payer: 59 | Attending: Hematology and Oncology

## 2019-06-03 ENCOUNTER — Other Ambulatory Visit: Payer: Self-pay

## 2019-06-03 DIAGNOSIS — K9589 Other complications of other bariatric procedure: Secondary | ICD-10-CM

## 2019-06-03 DIAGNOSIS — Z3202 Encounter for pregnancy test, result negative: Secondary | ICD-10-CM | POA: Insufficient documentation

## 2019-06-03 DIAGNOSIS — D509 Iron deficiency anemia, unspecified: Secondary | ICD-10-CM | POA: Insufficient documentation

## 2019-06-03 DIAGNOSIS — D508 Other iron deficiency anemias: Secondary | ICD-10-CM

## 2019-06-03 LAB — CBC WITH DIFFERENTIAL/PLATELET
Abs Immature Granulocytes: 0.01 10*3/uL (ref 0.00–0.07)
Basophils Absolute: 0 10*3/uL (ref 0.0–0.1)
Basophils Relative: 1 %
Eosinophils Absolute: 0.5 10*3/uL (ref 0.0–0.5)
Eosinophils Relative: 6 %
HCT: 36.3 % (ref 36.0–46.0)
Hemoglobin: 11.4 g/dL — ABNORMAL LOW (ref 12.0–15.0)
Immature Granulocytes: 0 %
Lymphocytes Relative: 38 %
Lymphs Abs: 2.8 10*3/uL (ref 0.7–4.0)
MCH: 24.5 pg — ABNORMAL LOW (ref 26.0–34.0)
MCHC: 31.4 g/dL (ref 30.0–36.0)
MCV: 78.1 fL — ABNORMAL LOW (ref 80.0–100.0)
Monocytes Absolute: 0.5 10*3/uL (ref 0.1–1.0)
Monocytes Relative: 7 %
Neutro Abs: 3.5 10*3/uL (ref 1.7–7.7)
Neutrophils Relative %: 48 %
Platelets: 314 10*3/uL (ref 150–400)
RBC: 4.65 MIL/uL (ref 3.87–5.11)
RDW: 18.9 % — ABNORMAL HIGH (ref 11.5–15.5)
WBC: 7.3 10*3/uL (ref 4.0–10.5)
nRBC: 0 % (ref 0.0–0.2)

## 2019-06-03 LAB — FERRITIN: Ferritin: 15 ng/mL (ref 11–307)

## 2019-06-04 ENCOUNTER — Telehealth: Payer: Self-pay

## 2019-06-04 ENCOUNTER — Ambulatory Visit: Payer: 59

## 2019-06-04 ENCOUNTER — Other Ambulatory Visit: Payer: Self-pay

## 2019-06-04 DIAGNOSIS — R8290 Unspecified abnormal findings in urine: Secondary | ICD-10-CM

## 2019-06-04 NOTE — Telephone Encounter (Signed)
-----   Message from Lequita Asal, MD sent at 06/04/2019  6:13 AM EDT ----- Regarding: Please call patient  Ferritin is 15 (low).  Needs additional IV iron.  Venofer weekly x 3.  M ----- Message ----- From: Buel Ream, Lab [facility] [place] Sent: 06/03/2019   3:37 PM EDT To: Lequita Asal, MD

## 2019-06-04 NOTE — Telephone Encounter (Signed)
Left a message to inform the patient that her ferritin is low and she will need  additional IV Iron x 3. I have informed the patient to contact the office as soon as possible

## 2019-06-05 ENCOUNTER — Encounter: Payer: Self-pay | Admitting: *Deleted

## 2019-06-05 ENCOUNTER — Other Ambulatory Visit: Payer: Self-pay

## 2019-06-05 ENCOUNTER — Inpatient Hospital Stay: Payer: 59

## 2019-06-05 VITALS — BP 101/69 | HR 99 | Temp 98.0°F | Resp 18

## 2019-06-05 DIAGNOSIS — R8290 Unspecified abnormal findings in urine: Secondary | ICD-10-CM

## 2019-06-05 DIAGNOSIS — D509 Iron deficiency anemia, unspecified: Secondary | ICD-10-CM

## 2019-06-05 DIAGNOSIS — K9589 Other complications of other bariatric procedure: Secondary | ICD-10-CM

## 2019-06-05 LAB — PREGNANCY, URINE: Preg Test, Ur: NEGATIVE

## 2019-06-05 MED ORDER — SODIUM CHLORIDE 0.9 % IV SOLN
Freq: Once | INTRAVENOUS | Status: AC
  Administered 2019-06-05: 09:00:00 via INTRAVENOUS
  Filled 2019-06-05: qty 250

## 2019-06-05 MED ORDER — IRON SUCROSE 20 MG/ML IV SOLN: 200.0000 mg | Freq: Once | INTRAVENOUS | Status: DC

## 2019-06-05 NOTE — Patient Instructions (Signed)

## 2019-06-05 NOTE — Progress Notes (Signed)
Patient reports that her left second toe has a burning sensation on the underside.  Toe was examined and there are no contusions, redness, drainage, etc.  Patient advised to see her primary care for her toe issue.  Patient also reported that her menses is heavier flow now as opposed to spotting.  Patient advised to see her OB/GYN for any menses concerns.  Patient verbalized understanding on both recommendations.

## 2019-06-10 ENCOUNTER — Other Ambulatory Visit: Payer: Self-pay

## 2019-06-10 ENCOUNTER — Inpatient Hospital Stay: Payer: 59 | Attending: Hematology and Oncology

## 2019-06-10 VITALS — BP 106/63 | HR 83 | Temp 99.4°F | Resp 18

## 2019-06-10 DIAGNOSIS — D509 Iron deficiency anemia, unspecified: Secondary | ICD-10-CM | POA: Insufficient documentation

## 2019-06-10 DIAGNOSIS — D508 Other iron deficiency anemias: Secondary | ICD-10-CM

## 2019-06-10 DIAGNOSIS — K9589 Other complications of other bariatric procedure: Secondary | ICD-10-CM

## 2019-06-10 MED ORDER — SODIUM CHLORIDE 0.9 % IV SOLN
Freq: Once | INTRAVENOUS | Status: AC
  Administered 2019-06-10: 11:00:00 via INTRAVENOUS
  Filled 2019-06-10: qty 250

## 2019-06-10 MED ORDER — IRON SUCROSE 20 MG/ML IV SOLN
200.0000 mg | Freq: Once | INTRAVENOUS | Status: AC
  Administered 2019-06-10: 200 mg via INTRAVENOUS

## 2019-06-10 NOTE — Patient Instructions (Signed)

## 2019-06-12 ENCOUNTER — Other Ambulatory Visit: Payer: 59

## 2019-06-17 ENCOUNTER — Inpatient Hospital Stay: Payer: 59

## 2019-06-17 ENCOUNTER — Other Ambulatory Visit: Payer: Self-pay

## 2019-06-17 VITALS — BP 96/60 | HR 93 | Temp 98.1°F | Resp 18

## 2019-06-17 DIAGNOSIS — D509 Iron deficiency anemia, unspecified: Secondary | ICD-10-CM

## 2019-06-17 DIAGNOSIS — K9589 Other complications of other bariatric procedure: Secondary | ICD-10-CM

## 2019-06-17 MED ORDER — IRON SUCROSE 20 MG/ML IV SOLN
200.0000 mg | Freq: Once | INTRAVENOUS | Status: AC
  Administered 2019-06-17: 200 mg via INTRAVENOUS

## 2019-06-17 MED ORDER — SODIUM CHLORIDE 0.9 % IV SOLN
Freq: Once | INTRAVENOUS | Status: AC
  Administered 2019-06-17: 11:00:00 via INTRAVENOUS
  Filled 2019-06-17: qty 250

## 2019-06-17 NOTE — Patient Instructions (Signed)

## 2019-09-02 ENCOUNTER — Other Ambulatory Visit: Payer: Self-pay

## 2019-09-02 DIAGNOSIS — K9589 Other complications of other bariatric procedure: Secondary | ICD-10-CM

## 2019-09-02 DIAGNOSIS — D509 Iron deficiency anemia, unspecified: Secondary | ICD-10-CM

## 2019-09-02 DIAGNOSIS — D508 Other iron deficiency anemias: Secondary | ICD-10-CM

## 2019-09-08 ENCOUNTER — Other Ambulatory Visit: Payer: Self-pay

## 2019-09-08 ENCOUNTER — Inpatient Hospital Stay: Payer: Managed Care, Other (non HMO) | Attending: Hematology and Oncology

## 2019-09-08 DIAGNOSIS — E538 Deficiency of other specified B group vitamins: Secondary | ICD-10-CM | POA: Diagnosis present

## 2019-09-08 DIAGNOSIS — D508 Other iron deficiency anemias: Secondary | ICD-10-CM

## 2019-09-08 DIAGNOSIS — D509 Iron deficiency anemia, unspecified: Secondary | ICD-10-CM | POA: Insufficient documentation

## 2019-09-08 DIAGNOSIS — K9589 Other complications of other bariatric procedure: Secondary | ICD-10-CM

## 2019-09-08 LAB — CBC WITH DIFFERENTIAL/PLATELET
Abs Immature Granulocytes: 0.02 10*3/uL (ref 0.00–0.07)
Basophils Absolute: 0.1 10*3/uL (ref 0.0–0.1)
Basophils Relative: 1 %
Eosinophils Absolute: 0.4 10*3/uL (ref 0.0–0.5)
Eosinophils Relative: 6 %
HCT: 37.8 % (ref 36.0–46.0)
Hemoglobin: 12 g/dL (ref 12.0–15.0)
Immature Granulocytes: 0 %
Lymphocytes Relative: 35 %
Lymphs Abs: 2.4 10*3/uL (ref 0.7–4.0)
MCH: 26.4 pg (ref 26.0–34.0)
MCHC: 31.7 g/dL (ref 30.0–36.0)
MCV: 83.3 fL (ref 80.0–100.0)
Monocytes Absolute: 0.6 10*3/uL (ref 0.1–1.0)
Monocytes Relative: 9 %
Neutro Abs: 3.4 10*3/uL (ref 1.7–7.7)
Neutrophils Relative %: 49 %
Platelets: 359 10*3/uL (ref 150–400)
RBC: 4.54 MIL/uL (ref 3.87–5.11)
RDW: 13.2 % (ref 11.5–15.5)
WBC: 6.9 10*3/uL (ref 4.0–10.5)
nRBC: 0 % (ref 0.0–0.2)

## 2019-09-08 LAB — FERRITIN: Ferritin: 74 ng/mL (ref 11–307)

## 2019-09-08 LAB — VITAMIN B12: Vitamin B-12: 273 pg/mL (ref 180–914)

## 2019-09-08 LAB — FOLATE: Folate: 9.2 ng/mL (ref >5.9)

## 2019-09-08 NOTE — Progress Notes (Signed)
[facility]  [address], [address] [place], [place] [address] Phone: [phone]  Fax: [phone]   Telemedicine Office Visit:  09/09/2019  Referring physician: Lovenia Kim, MD  I connected with Leah Solis on 09/09/2019 at 1:09 PM by videoconferencing and verified that I was speaking with the correct person using 2 identifiers.  The patient was in her car.  I discussed the limitations, risk, security and privacy concerns of performing an evaluation and management service by videoconferencing and the availability of in person appointments.  I also discussed with the patient that there may be a patient responsible charge related to this service.  The patient expressed understanding and agreed to proceed.   Chief Complaint: Leah Solis is a 38 y.o. female with s/p gastric bypass with iron deficiency anemia who is seen for 6 month assessment.  HPI: The patient was last seen in the hematology clinic on 03/11/2019 for initial consultation. At that time, she was fatigued.  Exam was unremarkable.   Work up revealed hematocrit 26.5, hemoglobin 7.4, MCV 61.1, platelets 776,000, WBC 8,500. Ferritin was 5 with an iron saturation of 2% and a TIBC of 513. Pregnancy urine test was negative.   Patient received Venofer weekly x 4 (03/11/2019 - 04/01/2019).   Labs followed: 04/21/2019: Hematocrit 36.1, hemoglobin 10.9, MCV 73.5. platelets 367,000, WBC 5,500. Ferritin 53.  06/03/2019: Hematocrit 36.3, hemoglobin 11.4, MCV 78.1, platelets 314,000, WBC 7,300. Ferritin 15.   She received Venofer weekly x 3 (06/05/2019 - 06/17/2019).  Bone density on 03/18/2019 was normal with a T-score of 0.40 at the AP total spine and - 1.2 in the right femoral neck.   During the interim, she has felt more energized after receiving Venofer. She notes her most recent menstrual cycle was extremely heavy; she felt tired. She is not taking oral B12. The patient would like to receive  B12 injections.    Past Medical History:  Diagnosis Date  . Anemia   . Calcium deficiency     Past Surgical History:  Procedure Laterality Date  . BARIATRIC SURGERY    . CESAREAN SECTION      History reviewed. No pertinent family history.  Social History:  reports that she has never smoked. She has never used smokeless tobacco. No history on file for alcohol and drug. She does not smoke or drink. She denies any known exposure to radiation or toxins. She lives in [place], [place]. She is a Designer, multimedia at [employer]. The patient is accompanied by her daughter and grandson today.  Participants in the patient's visit and their role in the encounter included the patient, her daughter and grandson, and [place], Therapist, sports, today.  The intake visit was provided by Waymon Budge, RN.  Allergies: No Known Allergies  Current Medications: Current Outpatient Medications  Medication Sig Dispense Refill  . ferrous gluconate (FERGON) 324 MG tablet Take 1 tablet (324 mg total) by mouth 2 (two) times daily with a meal. (Patient taking differently: Take 324 mg by mouth daily with breakfast. ) 60 tablet 2  . docusate sodium (COLACE) 100 MG capsule Take 1 capsule (100 mg total) by mouth daily. (Patient not taking: Reported on 03/11/2019) 30 capsule 2  . hydrOXYzine (ATARAX/VISTARIL) 25 MG tablet Take 1 tablet (25 mg total) by mouth 3 (three) times daily as needed. (Patient not taking: Reported on 03/11/2019) 30 tablet 0  . Vitamin D, Ergocalciferol, (DRISDOL) 1.25 MG (50000 UT) CAPS capsule      No current  facility-administered medications for this visit.     Review of Systems  Constitutional: Negative for chills, diaphoresis, fever, malaise/fatigue (intermittent) and weight loss.       Energy level improved after Venofer.  HENT: Negative.  Negative for congestion, ear pain, hearing loss, nosebleeds, sinus pain, sore throat and tinnitus.   Eyes: Negative.  Negative for blurred vision and  double vision.  Respiratory: Negative.  Negative for cough, sputum production, shortness of breath (on exertion) and wheezing.   Cardiovascular: Negative.  Negative for chest pain, palpitations, orthopnea, leg swelling and PND.  Gastrointestinal: Negative for abdominal pain, blood in stool, constipation (when on oral iron), diarrhea, heartburn, melena, nausea and vomiting.       Ice pica.  Genitourinary: Negative for dysuria, frequency and urgency.       Heavy menses.  Musculoskeletal: Negative.  Negative for back pain, joint pain and myalgias.  Skin: Negative.  Negative for rash.  Neurological: Negative.  Negative for dizziness, speech change, focal weakness, weakness and headaches.  Endo/Heme/Allergies: Negative for environmental allergies (rhinorrea). Does not bruise/bleed easily.  Psychiatric/Behavioral: Negative.  Negative for depression and memory loss. The patient is not nervous/anxious and does not have insomnia.   All other systems reviewed and are negative.  Performance status (ECOG): 0-1  Physical Exam  Constitutional: She is oriented to person, place, and time. She appears well-developed and well-nourished. No distress.  HENT:  Head: Normocephalic and atraumatic.  Dark braided hair.   Eyes: Conjunctivae and EOM are normal. No scleral icterus.  Brown eyes.   Neurological: She is alert and oriented to person, place, and time.  Skin: She is not diaphoretic.  Psychiatric: She has a normal mood and affect. Her behavior is normal. Judgment and thought content normal.  Nursing note reviewed.   Appointment on 09/08/2019  Component Date Value Ref Range Status  . Folate 09/08/2019 9.2  >5.9 ng/mL Final   Performed at [facility], [address] [place], [place] [address]  . Vitamin B-12 09/08/2019 273  180 - 914 pg/mL Final   Comment: (NOTE) This assay is not validated for testing neonatal or myeloproliferative syndrome specimens for Vitamin B12 levels. Performed at  [facility], [address] [place], [place] [address]   . Ferritin 09/08/2019 74  11 - 307 ng/mL Final   Performed at [facility], [address] [place], [place] [address]  . WBC 09/08/2019 6.9  4.0 - 10.5 K/uL Final  . RBC 09/08/2019 4.54  3.87 - 5.11 MIL/uL Final  . Hemoglobin 09/08/2019 12.0  12.0 - 15.0 g/dL Final  . HCT 91/47/829512/10/2018 37.8  36.0 - 46.0 % Final  . MCV 09/08/2019 83.3  80.0 - 100.0 fL Final  . MCH 09/08/2019 26.4  26.0 - 34.0 pg Final  . MCHC 09/08/2019 31.7  30.0 - 36.0 g/dL Final  . RDW 62/13/086512/10/2018 13.2  11.5 - 15.5 % Final  . Platelets 09/08/2019 359  150 - 400 K/uL Final  . nRBC 09/08/2019 0.0  0.0 - 0.2 % Final  . Neutrophils Relative % 09/08/2019 49  % Final  . Neutro Abs 09/08/2019 3.4  1.7 - 7.7 K/uL Final  . Lymphocytes Relative 09/08/2019 35  % Final  . Lymphs Abs 09/08/2019 2.4  0.7 - 4.0 K/uL Final  . Monocytes Relative 09/08/2019 9  % Final  . Monocytes Absolute 09/08/2019 0.6  0.1 - 1.0 K/uL Final  . Eosinophils Relative 09/08/2019 6  % Final  . Eosinophils Absolute 09/08/2019  0.4  0.0 - 0.5 K/uL Final  . Basophils Relative 09/08/2019 1  % Final  . Basophils Absolute 09/08/2019 0.1  0.0 - 0.1 K/uL Final  . Immature Granulocytes 09/08/2019 0  % Final  . Abs Immature Granulocytes 09/08/2019 0.02  0.00 - 0.07 K/uL Final   Performed at [facility], [address], [place], [place] [address]    Assessment:  Leah Solis is a 38 y.o. female s/p gastric bypass surgery (2015) with iron deficiency anemia.  She describes a history of iron deficiency for at least 20 years.  She denies menses x 2-3 months.  Oral iron causes constipation.  She has ice pica.    CBC on 02/25/2019 revealed a hematocrit 23.6, hemoglobin 6.0, MCV 56, platelets 451,000, and WBC 6400 (ANC 3400).  Creatinine 0.6.  Iron saturation was 2% with a TIBC of 529.  B12 was 384.  Folate was 14.7.  Work up on 03/11/2019 revealed hematocrit 26.5, hemoglobin  7.4, MCV 61.1, platelets 776,000, WBC 8,500. Ferritin was 5 with an iron saturation of 2% and a TIBC of 513.   She has received Venofer weekly x 4 (03/11/2019 - 04/01/2019) and x 3 (06/05/2019 - 06/17/2019).  Ferritin has been followed: 5 on 03/11/2019, 53 on 04/21/2019, 15 on 06/03/2019 and 74 on 09/08/2019.  She has B12 deficiency.  B12 was 273 on 09/08/2019.  Folate was 9.2 on 09/08/2019.  Symptomatically, she has felt more energized since Venofer.  She is able to exercise again.  Plan: 1.   Review labs from 09/08/2019.  2.   Iron deficiency anemia             Clinically, she is doing well s/p Venofer.             Hematocrit 26.5.  Hemoglobin 7.4.  MCV 61.1 on 03/11/2019.   Ferritin 5.  Hematocrit 37.8.  Hemoglobin 12.0.  MCV 83.3 on 09/08/2019.   Ferritin 74.  Discuss plan for future Venofer if ferritin < 30.  Continue to monitor. 3.   B12 deficiency  B12 was 273 and folate 9.2 on 09/08/2019.  Patient wishes to receive B12 injections.  Preauth B12.   Begin B12 when approved then monthly x 6. 4.   RTC in 2 months for labs (CBC, ferritin) and B12. 5.   RTC in 4 months for MD assessment, lab (CBC with diff, ferritin- day before), B12, and +/- Venofer.  I discussed the assessment and treatment plan with the patient.  The patient was provided an opportunity to ask questions and all were answered.  The patient agreed with the plan and demonstrated an understanding of the instructions.  The patient was advised to call back if the symptoms worsen or if the condition fails to improve as anticipated.  I provided 15 minutes (1:09 PM - 1:24 PM) of face-to-face video visit time during this this encounter and > 50% was spent counseling as documented under my assessment and plan.  I provided these services from the [facility] office.   Rosey Bath, MD, PhD    09/09/2019, 1:09 PM  I, Theador Hawthorne, am acting as scribe for [employer]. Merlene Pulling, MD, PhD.  I, Melissa C. Merlene Pulling, MD, have  reviewed the above documentation for accuracy and completeness, and I agree with the above.

## 2019-09-09 ENCOUNTER — Inpatient Hospital Stay (HOSPITAL_BASED_OUTPATIENT_CLINIC_OR_DEPARTMENT_OTHER): Payer: Managed Care, Other (non HMO) | Admitting: Hematology and Oncology

## 2019-09-09 ENCOUNTER — Inpatient Hospital Stay: Payer: Managed Care, Other (non HMO)

## 2019-09-09 ENCOUNTER — Encounter: Payer: Self-pay | Admitting: Hematology and Oncology

## 2019-09-09 DIAGNOSIS — E538 Deficiency of other specified B group vitamins: Secondary | ICD-10-CM

## 2019-09-09 DIAGNOSIS — D509 Iron deficiency anemia, unspecified: Secondary | ICD-10-CM

## 2019-09-10 ENCOUNTER — Other Ambulatory Visit: Payer: Self-pay

## 2019-09-10 ENCOUNTER — Inpatient Hospital Stay: Payer: Managed Care, Other (non HMO)

## 2019-09-10 VITALS — BP 138/78 | HR 69 | Temp 97.3°F | Resp 17

## 2019-09-10 DIAGNOSIS — K9589 Other complications of other bariatric procedure: Secondary | ICD-10-CM

## 2019-09-10 DIAGNOSIS — D509 Iron deficiency anemia, unspecified: Secondary | ICD-10-CM

## 2019-09-10 MED ORDER — CYANOCOBALAMIN 1000 MCG/ML IJ SOLN
1000.0000 ug | Freq: Once | INTRAMUSCULAR | Status: AC
  Administered 2019-09-10: 1000 ug via INTRAMUSCULAR

## 2019-10-08 ENCOUNTER — Inpatient Hospital Stay: Payer: Managed Care, Other (non HMO)

## 2019-10-08 ENCOUNTER — Other Ambulatory Visit: Payer: Self-pay

## 2019-10-08 VITALS — BP 103/71 | HR 76 | Temp 97.6°F | Resp 16

## 2019-10-08 DIAGNOSIS — D509 Iron deficiency anemia, unspecified: Secondary | ICD-10-CM | POA: Diagnosis not present

## 2019-10-08 DIAGNOSIS — K9589 Other complications of other bariatric procedure: Secondary | ICD-10-CM

## 2019-10-08 MED ORDER — CYANOCOBALAMIN 1000 MCG/ML IJ SOLN
1000.0000 ug | Freq: Once | INTRAMUSCULAR | Status: AC
  Administered 2019-10-08: 1000 ug via INTRAMUSCULAR

## 2019-11-05 ENCOUNTER — Inpatient Hospital Stay: Payer: Managed Care, Other (non HMO)

## 2019-11-05 ENCOUNTER — Other Ambulatory Visit: Payer: Self-pay

## 2019-11-05 ENCOUNTER — Inpatient Hospital Stay: Payer: Managed Care, Other (non HMO) | Attending: Hematology and Oncology

## 2019-11-05 VITALS — BP 119/71 | HR 77 | Temp 96.6°F | Resp 16

## 2019-11-05 DIAGNOSIS — R8290 Unspecified abnormal findings in urine: Secondary | ICD-10-CM

## 2019-11-05 DIAGNOSIS — D509 Iron deficiency anemia, unspecified: Secondary | ICD-10-CM | POA: Diagnosis not present

## 2019-11-05 DIAGNOSIS — K9589 Other complications of other bariatric procedure: Secondary | ICD-10-CM

## 2019-11-05 DIAGNOSIS — D508 Other iron deficiency anemias: Secondary | ICD-10-CM

## 2019-11-05 DIAGNOSIS — E538 Deficiency of other specified B group vitamins: Secondary | ICD-10-CM | POA: Diagnosis present

## 2019-11-05 LAB — CBC WITH DIFFERENTIAL/PLATELET
Abs Immature Granulocytes: 0.02 10*3/uL (ref 0.00–0.07)
Basophils Absolute: 0.1 10*3/uL (ref 0.0–0.1)
Basophils Relative: 1 %
Eosinophils Absolute: 0.3 10*3/uL (ref 0.0–0.5)
Eosinophils Relative: 4 %
HCT: 39.4 % (ref 36.0–46.0)
Hemoglobin: 12.6 g/dL (ref 12.0–15.0)
Immature Granulocytes: 0 %
Lymphocytes Relative: 32 %
Lymphs Abs: 2.4 10*3/uL (ref 0.7–4.0)
MCH: 26.2 pg (ref 26.0–34.0)
MCHC: 32 g/dL (ref 30.0–36.0)
MCV: 81.9 fL (ref 80.0–100.0)
Monocytes Absolute: 0.5 10*3/uL (ref 0.1–1.0)
Monocytes Relative: 7 %
Neutro Abs: 4.2 10*3/uL (ref 1.7–7.7)
Neutrophils Relative %: 56 %
Platelets: 354 10*3/uL (ref 150–400)
RBC: 4.81 MIL/uL (ref 3.87–5.11)
RDW: 13.1 % (ref 11.5–15.5)
WBC: 7.4 10*3/uL (ref 4.0–10.5)
nRBC: 0 % (ref 0.0–0.2)

## 2019-11-05 LAB — PREGNANCY, URINE: Preg Test, Ur: NEGATIVE

## 2019-11-05 LAB — FERRITIN: Ferritin: 66 ng/mL (ref 11–307)

## 2019-11-05 MED ORDER — CYANOCOBALAMIN 1000 MCG/ML IJ SOLN
1000.0000 ug | Freq: Once | INTRAMUSCULAR | Status: AC
  Administered 2019-11-05: 08:00:00 1000 ug via INTRAMUSCULAR

## 2019-11-05 NOTE — Patient Instructions (Signed)

## 2019-11-11 ENCOUNTER — Encounter: Payer: Self-pay | Admitting: Hematology and Oncology

## 2019-11-13 ENCOUNTER — Inpatient Hospital Stay: Payer: 59 | Attending: Hematology and Oncology

## 2019-11-13 ENCOUNTER — Other Ambulatory Visit: Payer: Self-pay

## 2019-11-13 DIAGNOSIS — D509 Iron deficiency anemia, unspecified: Secondary | ICD-10-CM | POA: Diagnosis present

## 2019-11-13 DIAGNOSIS — K9589 Other complications of other bariatric procedure: Secondary | ICD-10-CM

## 2019-11-13 DIAGNOSIS — E538 Deficiency of other specified B group vitamins: Secondary | ICD-10-CM | POA: Insufficient documentation

## 2019-11-13 LAB — CBC WITH DIFFERENTIAL/PLATELET
Abs Immature Granulocytes: 0.02 10*3/uL (ref 0.00–0.07)
Basophils Absolute: 0.1 10*3/uL (ref 0.0–0.1)
Basophils Relative: 1 %
Eosinophils Absolute: 0.3 10*3/uL (ref 0.0–0.5)
Eosinophils Relative: 4 %
HCT: 36.4 % (ref 36.0–46.0)
Hemoglobin: 11.5 g/dL — ABNORMAL LOW (ref 12.0–15.0)
Immature Granulocytes: 0 %
Lymphocytes Relative: 40 %
Lymphs Abs: 3.2 10*3/uL (ref 0.7–4.0)
MCH: 26.1 pg (ref 26.0–34.0)
MCHC: 31.6 g/dL (ref 30.0–36.0)
MCV: 82.7 fL (ref 80.0–100.0)
Monocytes Absolute: 0.7 10*3/uL (ref 0.1–1.0)
Monocytes Relative: 8 %
Neutro Abs: 3.8 10*3/uL (ref 1.7–7.7)
Neutrophils Relative %: 47 %
Platelets: 431 10*3/uL — ABNORMAL HIGH (ref 150–400)
RBC: 4.4 MIL/uL (ref 3.87–5.11)
RDW: 13.1 % (ref 11.5–15.5)
WBC: 8 10*3/uL (ref 4.0–10.5)
nRBC: 0 % (ref 0.0–0.2)

## 2019-11-14 LAB — FERRITIN: Ferritin: 48 ng/mL (ref 11–307)

## 2019-12-03 ENCOUNTER — Inpatient Hospital Stay: Payer: 59

## 2019-12-07 ENCOUNTER — Inpatient Hospital Stay: Payer: 59

## 2019-12-07 ENCOUNTER — Inpatient Hospital Stay: Payer: 59 | Attending: Hematology and Oncology

## 2019-12-30 ENCOUNTER — Other Ambulatory Visit: Payer: 59

## 2019-12-31 ENCOUNTER — Ambulatory Visit: Payer: 59

## 2019-12-31 ENCOUNTER — Ambulatory Visit: Payer: 59 | Admitting: Hematology and Oncology

## 2019-12-31 NOTE — Progress Notes (Signed)
[facility]  [address] [place], [place] [address] Phone: [phone]  Fax: [phone]   Clinic Day:  01/04/2020  Referring physician: Allayne Stack, DO  Chief Complaint: Leah Solis is a 39 y.o. female with s/p gastric bypasswithiron deficiency anemia who is seen for a 3 month assessment.  HPI: The patient was last seen in the hematology clinic on 09/09/2019 via telemedicine. At that time, she had felt more energized since Venofer. She was able to exercise again. Hematocrit was 37.8, hemoglobin 12.0, MCV 83.3, platelets 359,000, and WBC 6,900. Ferritin was 74. Vitamin B12 was 273 with folate of 9.2. Patient wished to receive a B12 injection.   Patient was given a B12 injections monthly (09/10/2019 - 11/05/2019).   Labs followed: 11/05/2019: Hematocrit 39.4, hemoglobin 12.6, MCV 81.9, platelets 354,000, WBC 7,400. Ferritin 66. 11/13/2019: Hematocrit 36.4, hemoglobin 11.5, MCV 82.7, platelets 431,000, WBC 8,000. Ferritin 48.  01/01/2020: Hematocrit 36.7, hemoglobin 11.6, MCV 82.3, platelets 355,000, WBC 8,300. Ferritin 43.  Symptomatically, the patient has been feeling tired. She notes low energy and she has not been exercising as much and she want to sleep most of the time. She notes not having a B12 injection for the month on 11/2019 secondary to not having an appointment set up.   Her goal weight is 150 pounds. She is currently 155 pounds and she will start back exercising to lose some weight. She has had some shortness of breath. She notes feeling thirsty but denies ice pica. Her menses are coming on slowly and not as heavy. She notes muscle and joint pain when she does not exercise. She feels bloated secondary to menses. She is constipated and taking Dulcolax.    Past Medical History:  Diagnosis Date  . Anemia   . Calcium deficiency     Past Surgical History:  Procedure Laterality Date  . BARIATRIC SURGERY    . CESAREAN  SECTION      No family history on file.  Social History:  reports that she has never smoked. She has never used smokeless tobacco. No history on file for alcohol and drug. She denies any known exposure to radiation or toxins. She lives in [place], [place]. 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  Allergies: No Known Allergies  Current Medications: Current Outpatient Medications  Medication Sig Dispense Refill  . docusate sodium (COLACE) 100 MG capsule Take 1 capsule (100 mg total) by mouth daily. 30 capsule 2  . ferrous gluconate (FERGON) 324 MG tablet Take 1 tablet (324 mg total) by mouth 2 (two) times daily with a meal. (Patient not taking: Reported on 01/04/2020) 60 tablet 2   No current facility-administered medications for this visit.    Review of Systems  Constitutional: Positive for malaise/fatigue (intermittent). Negative for chills, diaphoresis, fever and weight loss (up 23 lbs since 03/11/2019).       Low energy. Less active.  HENT: Negative.  Negative for congestion, ear pain, hearing loss, nosebleeds, sinus pain, sore throat and tinnitus.   Eyes: Negative.  Negative for blurred vision and double vision.  Respiratory: Positive for shortness of breath (on exertion). Negative for cough, sputum production and wheezing.   Cardiovascular: Negative.  Negative for chest pain, palpitations, orthopnea, leg swelling and PND.  Gastrointestinal: Positive for constipation (on dulcolax). Negative for abdominal pain, blood in stool, diarrhea, heartburn, melena, nausea and vomiting.       No ice pica. Feels thirsty. Bloated secondary  to menses.  Genitourinary: Negative for dysuria, frequency and urgency.       Heavy menses slowed up.  Musculoskeletal: Positive for joint pain and myalgias. Negative for back pain.  Skin: Negative.  Negative for rash.  Neurological: Negative.  Negative for dizziness, speech change, focal weakness, weakness and headaches.    Endo/Heme/Allergies: Negative for environmental allergies. Does not bruise/bleed easily.  Psychiatric/Behavioral: Negative.  Negative for depression and memory loss. The patient is not nervous/anxious and does not have insomnia.   All other systems reviewed and are negative.  Performance status (ECOG): 0-1  Vitals Blood pressure 115/78, pulse 64, temperature (!) 96.2 F (35.7 C), temperature source Tympanic, resp. rate 16, weight 155 lb 1.5 oz (70.4 kg), SpO2 100 %.   Physical Exam Vitals and nursing note reviewed.  Constitutional:      General: She is not in acute distress.    Appearance: She is well-developed and well-nourished. She is not diaphoretic.  HENT:     Head: Normocephalic and atraumatic.     Mouth/Throat:     Mouth: Oropharynx is clear and moist.     Pharynx: No oropharyngeal exudate.      Comments: Curly hair with red highlights. Mask. Eyes:     General: No scleral icterus.    Extraocular Movements: EOM normal.     Conjunctiva/sclera: Conjunctivae normal.     Pupils: Pupils are equal, round, and reactive to light.     Comments: Brown eyes.   Cardiovascular:     Rate and Rhythm: Normal rate and regular rhythm.     Heart sounds: Normal heart sounds. No murmur heard.   Pulmonary:     Effort: Pulmonary effort is normal. No respiratory distress.     Breath sounds: Normal breath sounds. No wheezing or rales.  Chest:     Chest wall: No tenderness.  Abdominal:     General: Bowel sounds are normal. There is no distension.     Palpations: Abdomen is soft. There is no mass.     Tenderness: There is no abdominal tenderness. There is no guarding or rebound.  Musculoskeletal:        General: No tenderness or edema. Normal range of motion.     Cervical back: Normal range of motion and neck supple.  Lymphadenopathy:     Head:     Right side of head: No preauricular, posterior auricular or occipital adenopathy.     Left side of head: No preauricular, posterior auricular  or occipital adenopathy.     Cervical: No cervical adenopathy.     Upper Body:  No axillary adenopathy present.    Right upper body: No supraclavicular adenopathy.     Left upper body: No supraclavicular adenopathy.     Lower Body: No right inguinal adenopathy. No left inguinal adenopathy.  Skin:    General: Skin is warm and dry.  Neurological:     Mental Status: She is alert and oriented to person, place, and time.  Psychiatric:        Mood and Affect: Mood and affect normal.        Behavior: Behavior normal.        Thought Content: Thought content normal.        Judgment: Judgment normal.     No visits with results within 3 Day(s) from this visit.  Latest known visit with results is:  Appointment on 01/01/2020  Component Date Value Ref Range Status  . Ferritin 01/01/2020 43  11 - 307 ng/mL  Final   Performed at [facility], [address], [place], [place] [address]  . WBC 01/01/2020 8.3  4.0 - 10.5 K/uL Final  . RBC 01/01/2020 4.46  3.87 - 5.11 MIL/uL Final  . Hemoglobin 01/01/2020 11.6* 12.0 - 15.0 g/dL Final  . HCT 71/24/5809 36.7  36.0 - 46.0 % Final  . MCV 01/01/2020 82.3  80.0 - 100.0 fL Final  . MCH 01/01/2020 26.0  26.0 - 34.0 pg Final  . MCHC 01/01/2020 31.6  30.0 - 36.0 g/dL Final  . RDW 98/33/8250 12.8  11.5 - 15.5 % Final  . Platelets 01/01/2020 355  150 - 400 K/uL Final  . nRBC 01/01/2020 0.0  0.0 - 0.2 % Final  . Neutrophils Relative % 01/01/2020 49  % Final  . Neutro Abs 01/01/2020 4.0  1.7 - 7.7 K/uL Final  . Lymphocytes Relative 01/01/2020 39  % Final  . Lymphs Abs 01/01/2020 3.3  0.7 - 4.0 K/uL Final  . Monocytes Relative 01/01/2020 7  % Final  . Monocytes Absolute 01/01/2020 0.6  0.1 - 1.0 K/uL Final  . Eosinophils Relative 01/01/2020 4  % Final  . Eosinophils Absolute 01/01/2020 0.3  0.0 - 0.5 K/uL Final  . Basophils Relative 01/01/2020 1  % Final  . Basophils Absolute 01/01/2020 0.1  0.0 - 0.1 K/uL Final  . Immature Granulocytes 01/01/2020  0  % Final  . Abs Immature Granulocytes 01/01/2020 0.03  0.00 - 0.07 K/uL Final   Performed at [facility], [address], [place], [place] [address]    Assessment:  Leah Solis is a 39 y.o. female s/p gastric bypass surgery(2015) withiron deficiency anemia.She describes a history of iron deficiency for at least 20 years. She denies menses x 2-3 months. Oral ironcauses constipation. She has ice pica.   CBC on05/20/2020revealed a hematocrit 23.6, hemoglobin 6.0, MCV 56, platelets 451,000,andWBC 6400 (ANC 3400).Creatinine 0.6. Iron saturation was 2% with a TIBC of 529. B12 was 384. Folate was 14.7.  Work up on 03/11/2019 revealed hematocrit 26.5, hemoglobin 7.4, MCV 61.1, platelets 776,000, WBC 8,500. Ferritin was 5 with an iron saturation of 2% and a TIBC of 513.   She has received Venofer weekly x 4 (03/11/2019 - 04/01/2019) and x 3 (06/05/2019 - 06/17/2019).  Ferritin has been followed: 5 on 03/11/2019, 53 on 04/21/2019, 15 on 06/03/2019, 74 on 09/08/2019, 6601/28/2021, 48 on 11/13/2019, and 43 on 01/01/2020.  She receives Venofer if ferritin is < 30.  She has B12 deficiency.  B12 was 273 on 09/08/2019.  She began monthly B12 on 09/09/2020 (last 11/05/2019).  Folate was 9.2 on 09/08/2019.  Symptomatically, she feels tired.  She describes some shortness of breath.  She denies any ice pica.  Plan: 1.   Review labs from 01/01/2020. 2.Iron deficiency anemia Clinically, she is doing fair. Hematocrit 26.5.  Hemoglobin 7.4.  MCV 61.1 on 03/11/2019.                         Ferritin 5.             Hematocrit 36.7.  Hemoglobin 111.6.  MCV 82.3 on 01/01/2020.                         Ferritin 43.             No Venofer today.             Continue to  monitor. 3.B12 deficiency             B12 was 273 and folate 9.2 on 09/08/2019.             She receives B12 injections.             B12 today and monthly x6. 4.RTC in 2  months (when she comes for B12) and labs (CBC and ferritin).   5.   RTC in 4 months for MD assessment, labs (CBC with diff, ferritin-day before) B12 and +/- Venofer.    I discussed the assessment and treatment plan with the patient.  The patient was provided an opportunity to ask questions and all were answered.  The patient agreed with the plan and demonstrated an understanding of the instructions.  The patient was advised to call back if the symptoms worsen or if the condition fails to improve as anticipated.  I provided 10 minutes of face-to-face time during this encounter and > 50% was spent counseling as documented under my assessment and plan.  An additional 5 minutes were spent reviewing her chart (Epic and Care Everywhere) including notes, labs, and imaging studies.    Rosey Bath, MD, PhD    01/04/2020, 11:23 AM  I, Theador Hawthorne, am acting as scribe for [employer]. Merlene Pulling, MD, PhD.  I, Kayleann Mccaffery C. Merlene Pulling, MD, have reviewed the above documentation for accuracy and completeness, and I agree with the above.

## 2020-01-01 ENCOUNTER — Inpatient Hospital Stay: Payer: 59 | Attending: Hematology and Oncology

## 2020-01-01 DIAGNOSIS — Z9884 Bariatric surgery status: Secondary | ICD-10-CM | POA: Diagnosis not present

## 2020-01-01 DIAGNOSIS — K9589 Other complications of other bariatric procedure: Secondary | ICD-10-CM

## 2020-01-01 DIAGNOSIS — E538 Deficiency of other specified B group vitamins: Secondary | ICD-10-CM | POA: Insufficient documentation

## 2020-01-01 DIAGNOSIS — D509 Iron deficiency anemia, unspecified: Secondary | ICD-10-CM

## 2020-01-01 DIAGNOSIS — D508 Other iron deficiency anemias: Secondary | ICD-10-CM | POA: Diagnosis not present

## 2020-01-01 LAB — CBC WITH DIFFERENTIAL/PLATELET
Abs Immature Granulocytes: 0.03 10*3/uL (ref 0.00–0.07)
Basophils Absolute: 0.1 10*3/uL (ref 0.0–0.1)
Basophils Relative: 1 %
Eosinophils Absolute: 0.3 10*3/uL (ref 0.0–0.5)
Eosinophils Relative: 4 %
HCT: 36.7 % (ref 36.0–46.0)
Hemoglobin: 11.6 g/dL — ABNORMAL LOW (ref 12.0–15.0)
Immature Granulocytes: 0 %
Lymphocytes Relative: 39 %
Lymphs Abs: 3.3 10*3/uL (ref 0.7–4.0)
MCH: 26 pg (ref 26.0–34.0)
MCHC: 31.6 g/dL (ref 30.0–36.0)
MCV: 82.3 fL (ref 80.0–100.0)
Monocytes Absolute: 0.6 10*3/uL (ref 0.1–1.0)
Monocytes Relative: 7 %
Neutro Abs: 4 10*3/uL (ref 1.7–7.7)
Neutrophils Relative %: 49 %
Platelets: 355 10*3/uL (ref 150–400)
RBC: 4.46 MIL/uL (ref 3.87–5.11)
RDW: 12.8 % (ref 11.5–15.5)
WBC: 8.3 10*3/uL (ref 4.0–10.5)
nRBC: 0 % (ref 0.0–0.2)

## 2020-01-01 LAB — FERRITIN: Ferritin: 43 ng/mL (ref 11–307)

## 2020-01-04 ENCOUNTER — Inpatient Hospital Stay (HOSPITAL_BASED_OUTPATIENT_CLINIC_OR_DEPARTMENT_OTHER): Payer: 59 | Admitting: Hematology and Oncology

## 2020-01-04 ENCOUNTER — Encounter: Payer: Self-pay | Admitting: Hematology and Oncology

## 2020-01-04 ENCOUNTER — Inpatient Hospital Stay: Payer: 59

## 2020-01-04 VITALS — BP 115/78 | HR 64 | Temp 96.2°F | Resp 16 | Wt 155.1 lb

## 2020-01-04 DIAGNOSIS — D508 Other iron deficiency anemias: Secondary | ICD-10-CM

## 2020-01-04 DIAGNOSIS — D509 Iron deficiency anemia, unspecified: Secondary | ICD-10-CM

## 2020-01-04 DIAGNOSIS — K9589 Other complications of other bariatric procedure: Secondary | ICD-10-CM

## 2020-01-04 DIAGNOSIS — E538 Deficiency of other specified B group vitamins: Secondary | ICD-10-CM | POA: Diagnosis not present

## 2020-01-04 MED ORDER — CYANOCOBALAMIN 1000 MCG/ML IJ SOLN
1000.0000 ug | Freq: Once | INTRAMUSCULAR | Status: AC
  Administered 2020-01-04: 1000 ug via INTRAMUSCULAR

## 2020-01-04 NOTE — Patient Instructions (Signed)

## 2020-01-04 NOTE — Progress Notes (Signed)
Patient here for follow up. Denies any concerns.  

## 2020-01-28 ENCOUNTER — Ambulatory Visit: Payer: 59

## 2020-02-01 ENCOUNTER — Ambulatory Visit: Payer: Managed Care, Other (non HMO)

## 2020-02-01 ENCOUNTER — Inpatient Hospital Stay: Payer: 59 | Attending: Hematology and Oncology

## 2020-02-05 ENCOUNTER — Ambulatory Visit: Payer: 59

## 2020-02-08 ENCOUNTER — Other Ambulatory Visit: Payer: Self-pay

## 2020-02-08 ENCOUNTER — Inpatient Hospital Stay: Payer: 59 | Attending: Hematology and Oncology

## 2020-02-08 VITALS — BP 112/78 | HR 94 | Temp 96.6°F | Resp 16

## 2020-02-08 DIAGNOSIS — E538 Deficiency of other specified B group vitamins: Secondary | ICD-10-CM | POA: Insufficient documentation

## 2020-02-08 DIAGNOSIS — D509 Iron deficiency anemia, unspecified: Secondary | ICD-10-CM | POA: Insufficient documentation

## 2020-02-08 DIAGNOSIS — K9589 Other complications of other bariatric procedure: Secondary | ICD-10-CM

## 2020-02-08 DIAGNOSIS — D508 Other iron deficiency anemias: Secondary | ICD-10-CM

## 2020-02-08 MED ORDER — CYANOCOBALAMIN 1000 MCG/ML IJ SOLN
1000.0000 ug | Freq: Once | INTRAMUSCULAR | Status: AC
  Administered 2020-02-08: 1000 ug via INTRAMUSCULAR

## 2020-02-08 NOTE — Patient Instructions (Signed)

## 2020-02-25 ENCOUNTER — Ambulatory Visit: Payer: 59

## 2020-02-29 ENCOUNTER — Ambulatory Visit: Payer: Managed Care, Other (non HMO)

## 2020-02-29 ENCOUNTER — Inpatient Hospital Stay: Payer: 59

## 2020-02-29 ENCOUNTER — Other Ambulatory Visit: Payer: Self-pay

## 2020-02-29 DIAGNOSIS — K9589 Other complications of other bariatric procedure: Secondary | ICD-10-CM

## 2020-02-29 DIAGNOSIS — R8290 Unspecified abnormal findings in urine: Secondary | ICD-10-CM

## 2020-02-29 DIAGNOSIS — D509 Iron deficiency anemia, unspecified: Secondary | ICD-10-CM | POA: Diagnosis not present

## 2020-02-29 LAB — CBC WITH DIFFERENTIAL/PLATELET
Abs Immature Granulocytes: 0.04 10*3/uL (ref 0.00–0.07)
Basophils Absolute: 0.1 10*3/uL (ref 0.0–0.1)
Basophils Relative: 1 %
Eosinophils Absolute: 0.3 10*3/uL (ref 0.0–0.5)
Eosinophils Relative: 4 %
HCT: 37.7 % (ref 36.0–46.0)
Hemoglobin: 11.9 g/dL — ABNORMAL LOW (ref 12.0–15.0)
Immature Granulocytes: 1 %
Lymphocytes Relative: 30 %
Lymphs Abs: 2.6 10*3/uL (ref 0.7–4.0)
MCH: 26 pg (ref 26.0–34.0)
MCHC: 31.6 g/dL (ref 30.0–36.0)
MCV: 82.5 fL (ref 80.0–100.0)
Monocytes Absolute: 0.8 10*3/uL (ref 0.1–1.0)
Monocytes Relative: 9 %
Neutro Abs: 4.8 10*3/uL (ref 1.7–7.7)
Neutrophils Relative %: 55 %
Platelets: 406 10*3/uL — ABNORMAL HIGH (ref 150–400)
RBC: 4.57 MIL/uL (ref 3.87–5.11)
RDW: 13.4 % (ref 11.5–15.5)
WBC: 8.5 10*3/uL (ref 4.0–10.5)
nRBC: 0 % (ref 0.0–0.2)

## 2020-02-29 LAB — FERRITIN: Ferritin: 15 ng/mL (ref 11–307)

## 2020-03-01 ENCOUNTER — Telehealth: Payer: Self-pay

## 2020-03-01 NOTE — Telephone Encounter (Signed)
-----   Message from Rosey Bath, MD sent at 03/01/2020 12:58 PM EDT ----- Regarding: Please call patient  Ferritin 15.  Consider Venofer weekly x 2-3.  M ----- Message ----- From: Leory Plowman, Lab In [place] Sent: 02/29/2020   3:02 PM EDT To: Rosey Bath, MD

## 2020-03-01 NOTE — Telephone Encounter (Signed)
Patient aware and agreeable. Leah Solis to call and schedule

## 2020-03-02 ENCOUNTER — Other Ambulatory Visit: Payer: Self-pay

## 2020-03-02 ENCOUNTER — Ambulatory Visit: Payer: 59

## 2020-03-02 ENCOUNTER — Inpatient Hospital Stay: Payer: 59

## 2020-03-02 VITALS — BP 138/79 | HR 86 | Temp 99.0°F | Resp 18

## 2020-03-02 DIAGNOSIS — R8290 Unspecified abnormal findings in urine: Secondary | ICD-10-CM

## 2020-03-02 DIAGNOSIS — D509 Iron deficiency anemia, unspecified: Secondary | ICD-10-CM | POA: Diagnosis not present

## 2020-03-02 DIAGNOSIS — D508 Other iron deficiency anemias: Secondary | ICD-10-CM

## 2020-03-02 DIAGNOSIS — K9589 Other complications of other bariatric procedure: Secondary | ICD-10-CM

## 2020-03-02 LAB — PREGNANCY, URINE: Preg Test, Ur: NEGATIVE

## 2020-03-02 MED ORDER — SODIUM CHLORIDE 0.9 % IV SOLN
Freq: Once | INTRAVENOUS | Status: AC
  Filled 2020-03-02: qty 250

## 2020-03-02 MED ORDER — IRON SUCROSE 20 MG/ML IV SOLN
200.0000 mg | Freq: Once | INTRAVENOUS | Status: AC
  Administered 2020-03-02: 200 mg via INTRAVENOUS
  Filled 2020-03-02: qty 10

## 2020-03-10 ENCOUNTER — Inpatient Hospital Stay: Payer: 59 | Attending: Hematology and Oncology

## 2020-03-10 ENCOUNTER — Other Ambulatory Visit: Payer: Self-pay

## 2020-03-10 VITALS — BP 97/66 | HR 87 | Temp 96.7°F | Resp 18

## 2020-03-10 DIAGNOSIS — Z9884 Bariatric surgery status: Secondary | ICD-10-CM | POA: Insufficient documentation

## 2020-03-10 DIAGNOSIS — D508 Other iron deficiency anemias: Secondary | ICD-10-CM | POA: Diagnosis present

## 2020-03-10 DIAGNOSIS — E538 Deficiency of other specified B group vitamins: Secondary | ICD-10-CM | POA: Insufficient documentation

## 2020-03-10 DIAGNOSIS — K912 Postsurgical malabsorption, not elsewhere classified: Secondary | ICD-10-CM | POA: Insufficient documentation

## 2020-03-10 DIAGNOSIS — K9589 Other complications of other bariatric procedure: Secondary | ICD-10-CM

## 2020-03-10 MED ORDER — SODIUM CHLORIDE 0.9 % IV SOLN
Freq: Once | INTRAVENOUS | Status: AC
  Filled 2020-03-10: qty 250

## 2020-03-10 MED ORDER — IRON SUCROSE 20 MG/ML IV SOLN
200.0000 mg | Freq: Once | INTRAVENOUS | Status: AC
  Administered 2020-03-10: 200 mg via INTRAVENOUS
  Filled 2020-03-10: qty 10

## 2020-03-10 MED ORDER — CYANOCOBALAMIN 1000 MCG/ML IJ SOLN
1000.0000 ug | Freq: Once | INTRAMUSCULAR | Status: AC
  Administered 2020-03-10: 1000 ug via INTRAMUSCULAR
  Filled 2020-03-10: qty 1

## 2020-03-10 NOTE — Progress Notes (Signed)
Pt tolerated infusion well. No s/s of reaction or distress noted. Pt and VS stable at discharge.  

## 2020-03-17 ENCOUNTER — Other Ambulatory Visit: Payer: Self-pay

## 2020-03-17 ENCOUNTER — Inpatient Hospital Stay: Payer: 59

## 2020-03-17 VITALS — BP 129/84 | HR 97 | Temp 98.2°F | Resp 18

## 2020-03-17 DIAGNOSIS — R8290 Unspecified abnormal findings in urine: Secondary | ICD-10-CM

## 2020-03-17 DIAGNOSIS — D508 Other iron deficiency anemias: Secondary | ICD-10-CM

## 2020-03-17 DIAGNOSIS — K9589 Other complications of other bariatric procedure: Secondary | ICD-10-CM

## 2020-03-17 LAB — PREGNANCY, URINE: Preg Test, Ur: NEGATIVE

## 2020-03-17 MED ORDER — IRON SUCROSE 20 MG/ML IV SOLN
200.0000 mg | Freq: Once | INTRAVENOUS | Status: AC
  Administered 2020-03-17: 200 mg via INTRAVENOUS

## 2020-03-17 MED ORDER — SODIUM CHLORIDE 0.9 % IV SOLN
Freq: Once | INTRAVENOUS | Status: AC
  Filled 2020-03-17: qty 250

## 2020-03-17 NOTE — Patient Instructions (Signed)

## 2020-03-28 ENCOUNTER — Inpatient Hospital Stay: Payer: 59

## 2020-04-08 ENCOUNTER — Inpatient Hospital Stay: Payer: 59

## 2020-04-12 ENCOUNTER — Other Ambulatory Visit: Payer: Self-pay

## 2020-04-12 ENCOUNTER — Inpatient Hospital Stay: Payer: 59 | Attending: Hematology and Oncology

## 2020-04-12 ENCOUNTER — Encounter: Payer: Self-pay | Admitting: Hematology and Oncology

## 2020-04-12 DIAGNOSIS — D509 Iron deficiency anemia, unspecified: Secondary | ICD-10-CM | POA: Diagnosis present

## 2020-04-12 DIAGNOSIS — E538 Deficiency of other specified B group vitamins: Secondary | ICD-10-CM | POA: Diagnosis present

## 2020-04-12 DIAGNOSIS — K9589 Other complications of other bariatric procedure: Secondary | ICD-10-CM

## 2020-04-12 LAB — CBC WITH DIFFERENTIAL/PLATELET
Abs Immature Granulocytes: 0.03 10*3/uL (ref 0.00–0.07)
Basophils Absolute: 0.1 10*3/uL (ref 0.0–0.1)
Basophils Relative: 1 %
Eosinophils Absolute: 0.5 10*3/uL (ref 0.0–0.5)
Eosinophils Relative: 6 %
HCT: 37.9 % (ref 36.0–46.0)
Hemoglobin: 12.2 g/dL (ref 12.0–15.0)
Immature Granulocytes: 0 %
Lymphocytes Relative: 34 %
Lymphs Abs: 2.6 10*3/uL (ref 0.7–4.0)
MCH: 26.3 pg (ref 26.0–34.0)
MCHC: 32.2 g/dL (ref 30.0–36.0)
MCV: 81.7 fL (ref 80.0–100.0)
Monocytes Absolute: 0.4 10*3/uL (ref 0.1–1.0)
Monocytes Relative: 6 %
Neutro Abs: 3.9 10*3/uL (ref 1.7–7.7)
Neutrophils Relative %: 53 %
Platelets: 393 10*3/uL (ref 150–400)
RBC: 4.64 MIL/uL (ref 3.87–5.11)
RDW: 13.3 % (ref 11.5–15.5)
WBC: 7.5 10*3/uL (ref 4.0–10.5)
nRBC: 0 % (ref 0.0–0.2)

## 2020-04-12 LAB — FERRITIN: Ferritin: 104 ng/mL (ref 11–307)

## 2020-04-12 NOTE — Progress Notes (Signed)
No new changes noted today. The patient Name and DOB has been verified by phone today. 

## 2020-04-12 NOTE — Progress Notes (Signed)
[facility]  [address], [address] [place], [place] [address] Phone: [phone]  Fax: [phone]   Clinic Day:  04/13/2020  Referring physician: Allayne Stack, DO  Chief Complaint: Leah Solis is a 39 y.o. female with s/p gastric bypasswithiron deficiency anemia and B12 deficiency who is seen for 3 month assessment.  HPI: The patient was last seen in the hematology clinic on 01/04/2020. At that time, she felt tired.  She described some shortness of breath.  She denied any ice pica. Hematocrit was 36.7, hemoglobin 11.6, MCV 82.3, platelets 355,000, WBC 8,300. Ferritin was 43. She received a vitamin B12 injection.  She received Vitamin B12 injections on 02/08/2020 and 03/10/2020.  She received Venofer weekly x 3 (03/02/2020- 03/17/2020).  Labs on 02/29/2020 revealed a hematocrit 37.7, hemoglobin 11.9, MCV 82.5, platelets 406,000, WBC 8,500. Ferritin was 15.  During the interim, she has been good. After she received Venofer, she felt a difference in her energy levels. She notes that it is difficult to tell when her iron is low because she lived with it for such a long time. She also notes that she feels worse during her periods.   She has a little bit of right shoulder pain, but it has improved. It no longer stops her from doing things. Her constipation and shortness of breath have resolved.  The patient reports that she has been gaining a lot of weight recently and is worried about becoming obese again. She is not eating any differently. She drinks Lars Masson from [place] quite often now that it is hot out. She has not touched base with her bariatric surgeon since she started coming here.  The patient will be getting her COVID-19 vaccine in the near future. She notes that she was exposed to COVID-19 last week but tested negative and has not had any symptoms.   Past Medical History:  Diagnosis Date  . Anemia   . Calcium deficiency     Past  Surgical History:  Procedure Laterality Date  . BARIATRIC SURGERY    . CESAREAN SECTION      History reviewed. No pertinent family history.  Social History:  reports that she has never smoked. She has never used smokeless tobacco. No history on file for alcohol use and drug use. She denies any known exposure to radiation or toxins. She lives in [place], [place]. 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  Allergies: No Known Allergies  Current Medications: Current Outpatient Medications  Medication Sig Dispense Refill  . docusate sodium (COLACE) 100 MG capsule Take 1 capsule (100 mg total) by mouth daily. (Patient not taking: Reported on 04/12/2020) 30 capsule 2  . ferrous gluconate (FERGON) 324 MG tablet Take 1 tablet (324 mg total) by mouth 2 (two) times daily with a meal. (Patient not taking: Reported on 01/04/2020) 60 tablet 2   No current facility-administered medications for this visit.    Review of Systems  Constitutional: Negative for chills, diaphoresis, fever, malaise/fatigue and weight loss (up 8 lbs).       Feels good. Gaining weight unintentionally.  HENT: Negative.  Negative for congestion, ear discharge, ear pain, hearing loss, nosebleeds, sinus pain, sore throat and tinnitus.   Eyes: Negative.  Negative for blurred vision and double vision.  Respiratory: Negative for cough, hemoptysis, sputum production and shortness of breath.   Cardiovascular: Negative.  Negative for chest pain, palpitations, orthopnea, leg swelling and PND.  Gastrointestinal: Negative for abdominal  pain, blood in stool, constipation, diarrhea, heartburn, melena, nausea and vomiting.       Diet unchanged.  Genitourinary: Negative for dysuria, frequency and urgency.  Musculoskeletal: Positive for joint pain (right shoulder, improved). Negative for back pain, myalgias and neck pain.  Skin: Negative.  Negative for rash.  Neurological: Negative.  Negative for dizziness, speech  change, focal weakness, weakness and headaches.  Endo/Heme/Allergies: Negative for environmental allergies. Does not bruise/bleed easily.  Psychiatric/Behavioral: Negative.  Negative for depression and memory loss. The patient is not nervous/anxious and does not have insomnia.   All other systems reviewed and are negative.  Performance status (ECOG): 0  Vitals Blood pressure 104/63, pulse 70, temperature (!) 96.4 F (35.8 C), temperature source Tympanic, resp. rate 18, weight 163 lb 2.3 oz (74 kg), SpO2 100 %.   Physical Exam Vitals and nursing note reviewed.  Constitutional:      General: She is not in acute distress.    Appearance: She is well-developed. She is not diaphoretic.  HENT:     Head: Normocephalic and atraumatic.     Comments: Braids.    Mouth/Throat:     Mouth: Mucous membranes are moist.     Pharynx: Oropharynx is clear. No oropharyngeal exudate.  Eyes:     General: No scleral icterus.    Extraocular Movements: Extraocular movements intact.     Conjunctiva/sclera: Conjunctivae normal.     Pupils: Pupils are equal, round, and reactive to light.     Comments: Brown eyes.   Cardiovascular:     Rate and Rhythm: Normal rate and regular rhythm.     Heart sounds: Normal heart sounds. No murmur heard.   Pulmonary:     Effort: Pulmonary effort is normal. No respiratory distress.     Breath sounds: Normal breath sounds. No wheezing or rales.  Chest:     Chest wall: No tenderness.  Abdominal:     General: Bowel sounds are normal. There is no distension.     Palpations: Abdomen is soft. There is no hepatomegaly, splenomegaly or mass.     Tenderness: There is no abdominal tenderness. There is no guarding or rebound.  Musculoskeletal:        General: No swelling or tenderness. Normal range of motion.     Cervical back: Normal range of motion and neck supple.  Lymphadenopathy:     Head:     Right side of head: No preauricular, posterior auricular or occipital adenopathy.      Left side of head: No preauricular, posterior auricular or occipital adenopathy.     Cervical: No cervical adenopathy.     Upper Body:     Right upper body: No supraclavicular or axillary adenopathy.     Left upper body: No supraclavicular or axillary adenopathy.     Lower Body: No right inguinal adenopathy. No left inguinal adenopathy.  Skin:    General: Skin is warm and dry.  Neurological:     Mental Status: She is alert and oriented to person, place, and time.  Psychiatric:        Behavior: Behavior normal.        Thought Content: Thought content normal.        Judgment: Judgment normal.    Appointment on 04/12/2020  Component Date Value Ref Range Status  . Ferritin 04/12/2020 104  11 - 307 ng/mL Final   Performed at [facility], [address] [place], [place] [address]  . WBC 04/12/2020 7.5  4.0 - 10.5  K/uL Final  . RBC 04/12/2020 4.64  3.87 - 5.11 MIL/uL Final  . Hemoglobin 04/12/2020 12.2  12.0 - 15.0 g/dL Final  . HCT 33/82/5053 37.9  36 - 46 % Final  . MCV 04/12/2020 81.7  80.0 - 100.0 fL Final  . MCH 04/12/2020 26.3  26.0 - 34.0 pg Final  . MCHC 04/12/2020 32.2  30.0 - 36.0 g/dL Final  . RDW 97/67/3419 13.3  11.5 - 15.5 % Final  . Platelets 04/12/2020 393  150 - 400 K/uL Final  . nRBC 04/12/2020 0.0  0.0 - 0.2 % Final  . Neutrophils Relative % 04/12/2020 53  % Final  . Neutro Abs 04/12/2020 3.9  1.7 - 7.7 K/uL Final  . Lymphocytes Relative 04/12/2020 34  % Final  . Lymphs Abs 04/12/2020 2.6  0.7 - 4.0 K/uL Final  . Monocytes Relative 04/12/2020 6  % Final  . Monocytes Absolute 04/12/2020 0.4  0 - 1 K/uL Final  . Eosinophils Relative 04/12/2020 6  % Final  . Eosinophils Absolute 04/12/2020 0.5  0 - 0 K/uL Final  . Basophils Relative 04/12/2020 1  % Final  . Basophils Absolute 04/12/2020 0.1  0 - 0 K/uL Final  . Immature Granulocytes 04/12/2020 0  % Final  . Abs Immature Granulocytes 04/12/2020 0.03  0.00 - 0.07 K/uL Final   Performed at [facility], [address], [place], [place] [address]    Assessment:  CYRIL RAILEY is a 39 y.o. female s/p gastric bypass surgery(2015) withiron deficiency anemia.She describes a history of iron deficiency for at least 20 years. She denies menses x 2-3 months. Oral ironcauses constipation. She has ice pica.   CBC on05/20/2020revealed a hematocrit 23.6, hemoglobin 6.0, MCV 56, platelets 451,000,andWBC 6400 (ANC 3400).Creatinine 0.6. Iron saturation was 2% with a TIBC of 529. B12 was 384. Folate was 14.7.  Work up on 03/11/2019 revealed hematocrit 26.5, hemoglobin 7.4, MCV 61.1, platelets 776,000, WBC 8,500. Ferritin was 5 with an iron saturation of 2% and a TIBC of 513.   She has received Venofer weekly x 4 (03/11/2019 - 04/01/2019), x 3 (06/05/2019 - 06/17/2019), and  x 3 (03/02/2020- 03/17/2020).  Ferritin has been followed: 5 on 03/11/2019, 53 on 04/21/2019, 15 on 06/03/2019, 74 on 09/08/2019, 6601/28/2021, 48 on 11/13/2019, 43 on 01/01/2020, 15 on 02/29/2020, and 104 on 04/12/2020.  She receives Venofer if ferritin is < 30.  She has B12 deficiency.  B12 was 273 on 09/08/2019.  She began monthly B12 on 09/09/2020 (last 03/10/2020).  Folate was 9.2 on 09/08/2019.  The patient will be getting her COVID-19 vaccine in the near future. She notes that she was exposed to COVID-19 last week but tested negative  Symptomatically, she feels good.  She notes fatigue during her menses (last 1.5 weeks ago).  Exam is stable.  Plan: 1.   Review labs from 04/12/2020. 2.Iron deficiency anemia Clinically, she is doing well. Hematocrit 26.5.  Hemoglobin 7.4.  MCV 61.1 on 03/11/2019.                         Ferritin 5.             Hematocrit 36.7.  Hemoglobin 111.6.  MCV 82.3 on 01/01/2020.                         Ferritin 43.  Hematocrit 37.9.  Hemoglobin 12.2.  MCV 81.7 on 04/12/2020.  Ferritin 104.             Ferritin goal 100.              No Venofer needed today. 3.B12 deficiency             B12 was 273 and folate 9.2 on 09/08/2019.             She receives B12 injections (last 03/10/2020).  B12 today and monthly x 6. 4.   RTC in 2 months and 4 months (when she comes for B12) and labs (CBC and ferritin).   5.   RTC in 6 months for MD assessment, labs (CBC with diff, ferritin-day before) B12 and +/- Venofer.   I discussed the assessment and treatment plan with the patient.  The patient was provided an opportunity to ask questions and all were answered.  The patient agreed with the plan and demonstrated an understanding of the instructions.  The patient was advised to call back if the symptoms worsen or if the condition fails to improve as anticipated.   Leah BathMelissa C Nijah Orlich, MD, PhD    04/13/2020, 12:31 PM  I, Danella PentonEmily J Tufford, am acting as Neurosurgeonscribe for General MotorsMelissa C. Merlene Pullingorcoran, MD, PhD.  I, Latoshia Monrroy C. Merlene Pullingorcoran, MD, have reviewed the above documentation for accuracy and completeness, and I agree with the above. .Marland Kitchen

## 2020-04-13 ENCOUNTER — Encounter: Payer: Self-pay | Admitting: Hematology and Oncology

## 2020-04-13 ENCOUNTER — Inpatient Hospital Stay: Payer: 59

## 2020-04-13 ENCOUNTER — Inpatient Hospital Stay (HOSPITAL_BASED_OUTPATIENT_CLINIC_OR_DEPARTMENT_OTHER): Payer: 59 | Admitting: Hematology and Oncology

## 2020-04-13 VITALS — BP 104/63 | HR 70 | Temp 96.4°F | Resp 18 | Wt 163.1 lb

## 2020-04-13 DIAGNOSIS — D508 Other iron deficiency anemias: Secondary | ICD-10-CM

## 2020-04-13 DIAGNOSIS — E538 Deficiency of other specified B group vitamins: Secondary | ICD-10-CM | POA: Diagnosis not present

## 2020-04-13 DIAGNOSIS — D509 Iron deficiency anemia, unspecified: Secondary | ICD-10-CM | POA: Diagnosis not present

## 2020-04-13 DIAGNOSIS — K9589 Other complications of other bariatric procedure: Secondary | ICD-10-CM

## 2020-04-13 DIAGNOSIS — R635 Abnormal weight gain: Secondary | ICD-10-CM | POA: Insufficient documentation

## 2020-04-13 MED ORDER — CYANOCOBALAMIN 1000 MCG/ML IJ SOLN
1000.0000 ug | Freq: Once | INTRAMUSCULAR | Status: AC
  Administered 2020-04-13: 1000 ug via INTRAMUSCULAR

## 2020-04-13 NOTE — Patient Instructions (Signed)

## 2020-04-21 ENCOUNTER — Other Ambulatory Visit: Payer: 59

## 2020-04-22 ENCOUNTER — Other Ambulatory Visit: Payer: 59

## 2020-04-25 ENCOUNTER — Ambulatory Visit: Payer: 59 | Admitting: Hematology and Oncology

## 2020-04-25 ENCOUNTER — Ambulatory Visit: Payer: 59

## 2020-04-25 ENCOUNTER — Inpatient Hospital Stay: Payer: 59

## 2020-05-11 ENCOUNTER — Inpatient Hospital Stay: Payer: 59

## 2020-05-16 ENCOUNTER — Other Ambulatory Visit: Payer: Self-pay

## 2020-05-16 ENCOUNTER — Inpatient Hospital Stay: Payer: 59 | Attending: Hematology and Oncology

## 2020-05-16 DIAGNOSIS — E538 Deficiency of other specified B group vitamins: Secondary | ICD-10-CM | POA: Diagnosis not present

## 2020-05-16 DIAGNOSIS — K9589 Other complications of other bariatric procedure: Secondary | ICD-10-CM

## 2020-05-16 MED ORDER — CYANOCOBALAMIN 1000 MCG/ML IJ SOLN
1000.0000 ug | Freq: Once | INTRAMUSCULAR | Status: AC
  Administered 2020-05-16: 1000 ug via INTRAMUSCULAR

## 2020-05-23 ENCOUNTER — Inpatient Hospital Stay: Payer: 59

## 2020-06-20 ENCOUNTER — Other Ambulatory Visit: Payer: 59

## 2020-06-20 ENCOUNTER — Inpatient Hospital Stay: Payer: 59

## 2020-06-20 ENCOUNTER — Inpatient Hospital Stay: Payer: 59 | Attending: Hematology and Oncology

## 2020-06-20 ENCOUNTER — Other Ambulatory Visit: Payer: Self-pay

## 2020-06-20 DIAGNOSIS — K9589 Other complications of other bariatric procedure: Secondary | ICD-10-CM

## 2020-06-20 DIAGNOSIS — E538 Deficiency of other specified B group vitamins: Secondary | ICD-10-CM | POA: Diagnosis present

## 2020-06-20 DIAGNOSIS — D508 Other iron deficiency anemias: Secondary | ICD-10-CM

## 2020-06-20 LAB — CBC WITH DIFFERENTIAL/PLATELET
Abs Immature Granulocytes: 0.04 10*3/uL (ref 0.00–0.07)
Basophils Absolute: 0.1 10*3/uL (ref 0.0–0.1)
Basophils Relative: 1 %
Eosinophils Absolute: 0.5 10*3/uL (ref 0.0–0.5)
Eosinophils Relative: 6 %
HCT: 37.4 % (ref 36.0–46.0)
Hemoglobin: 11.9 g/dL — ABNORMAL LOW (ref 12.0–15.0)
Immature Granulocytes: 1 %
Lymphocytes Relative: 31 %
Lymphs Abs: 2.5 10*3/uL (ref 0.7–4.0)
MCH: 26.2 pg (ref 26.0–34.0)
MCHC: 31.8 g/dL (ref 30.0–36.0)
MCV: 82.4 fL (ref 80.0–100.0)
Monocytes Absolute: 0.6 10*3/uL (ref 0.1–1.0)
Monocytes Relative: 7 %
Neutro Abs: 4.4 10*3/uL (ref 1.7–7.7)
Neutrophils Relative %: 54 %
Platelets: 365 10*3/uL (ref 150–400)
RBC: 4.54 MIL/uL (ref 3.87–5.11)
RDW: 13.3 % (ref 11.5–15.5)
WBC: 8.1 10*3/uL (ref 4.0–10.5)
nRBC: 0 % (ref 0.0–0.2)

## 2020-06-20 LAB — FERRITIN: Ferritin: 79 ng/mL (ref 11–307)

## 2020-06-20 MED ORDER — CYANOCOBALAMIN 1000 MCG/ML IJ SOLN
1000.0000 ug | Freq: Once | INTRAMUSCULAR | Status: AC
  Administered 2020-06-20: 1000 ug via INTRAMUSCULAR
  Filled 2020-06-20: qty 1

## 2020-07-18 ENCOUNTER — Inpatient Hospital Stay: Payer: 59 | Attending: Hematology and Oncology

## 2020-08-10 ENCOUNTER — Telehealth: Payer: Self-pay | Admitting: Hematology and Oncology

## 2020-08-10 NOTE — Telephone Encounter (Signed)
Pt is calling to modify her appt on 11/8. Please contact patient to discuss.

## 2020-08-15 ENCOUNTER — Other Ambulatory Visit: Payer: 59

## 2020-08-15 ENCOUNTER — Inpatient Hospital Stay: Payer: 59

## 2020-08-16 ENCOUNTER — Inpatient Hospital Stay: Payer: 59 | Attending: Hematology and Oncology

## 2020-08-16 ENCOUNTER — Other Ambulatory Visit: Payer: Self-pay

## 2020-08-16 ENCOUNTER — Inpatient Hospital Stay: Payer: 59

## 2020-08-16 DIAGNOSIS — D508 Other iron deficiency anemias: Secondary | ICD-10-CM

## 2020-08-16 DIAGNOSIS — E538 Deficiency of other specified B group vitamins: Secondary | ICD-10-CM | POA: Insufficient documentation

## 2020-08-16 DIAGNOSIS — K9589 Other complications of other bariatric procedure: Secondary | ICD-10-CM

## 2020-08-16 LAB — CBC WITH DIFFERENTIAL/PLATELET
Abs Immature Granulocytes: 0.02 10*3/uL (ref 0.00–0.07)
Basophils Absolute: 0.1 10*3/uL (ref 0.0–0.1)
Basophils Relative: 1 %
Eosinophils Absolute: 0.4 10*3/uL (ref 0.0–0.5)
Eosinophils Relative: 5 %
HCT: 39.1 % (ref 36.0–46.0)
Hemoglobin: 12.2 g/dL (ref 12.0–15.0)
Immature Granulocytes: 0 %
Lymphocytes Relative: 33 %
Lymphs Abs: 2.7 10*3/uL (ref 0.7–4.0)
MCH: 25.7 pg — ABNORMAL LOW (ref 26.0–34.0)
MCHC: 31.2 g/dL (ref 30.0–36.0)
MCV: 82.3 fL (ref 80.0–100.0)
Monocytes Absolute: 0.5 10*3/uL (ref 0.1–1.0)
Monocytes Relative: 6 %
Neutro Abs: 4.5 10*3/uL (ref 1.7–7.7)
Neutrophils Relative %: 55 %
Platelets: 441 10*3/uL — ABNORMAL HIGH (ref 150–400)
RBC: 4.75 MIL/uL (ref 3.87–5.11)
RDW: 13.2 % (ref 11.5–15.5)
WBC: 8.1 10*3/uL (ref 4.0–10.5)
nRBC: 0 % (ref 0.0–0.2)

## 2020-08-16 LAB — FERRITIN: Ferritin: 69 ng/mL (ref 11–307)

## 2020-08-16 MED ORDER — CYANOCOBALAMIN 1000 MCG/ML IJ SOLN
1000.0000 ug | Freq: Once | INTRAMUSCULAR | Status: AC
  Administered 2020-08-16: 1000 ug via INTRAMUSCULAR
  Filled 2020-08-16: qty 1

## 2020-08-17 ENCOUNTER — Telehealth: Payer: Self-pay | Admitting: *Deleted

## 2020-08-17 NOTE — Telephone Encounter (Signed)
Patient called asking if her FMLA paperwork has been done or not. Please return her call [phone]

## 2020-09-19 ENCOUNTER — Inpatient Hospital Stay: Payer: 59 | Attending: Hematology and Oncology

## 2020-10-19 ENCOUNTER — Inpatient Hospital Stay: Payer: 59 | Attending: Hematology and Oncology

## 2020-10-19 ENCOUNTER — Other Ambulatory Visit: Payer: 59

## 2020-10-19 ENCOUNTER — Other Ambulatory Visit: Payer: Self-pay

## 2020-10-19 DIAGNOSIS — D509 Iron deficiency anemia, unspecified: Secondary | ICD-10-CM | POA: Diagnosis present

## 2020-10-19 DIAGNOSIS — E538 Deficiency of other specified B group vitamins: Secondary | ICD-10-CM | POA: Diagnosis not present

## 2020-10-19 DIAGNOSIS — K9589 Other complications of other bariatric procedure: Secondary | ICD-10-CM

## 2020-10-19 DIAGNOSIS — D508 Other iron deficiency anemias: Secondary | ICD-10-CM

## 2020-10-19 LAB — CBC WITH DIFFERENTIAL/PLATELET
Abs Immature Granulocytes: 0.03 10*3/uL (ref 0.00–0.07)
Basophils Absolute: 0 10*3/uL (ref 0.0–0.1)
Basophils Relative: 0 %
Eosinophils Absolute: 0.6 10*3/uL — ABNORMAL HIGH (ref 0.0–0.5)
Eosinophils Relative: 6 %
HCT: 38 % (ref 36.0–46.0)
Hemoglobin: 11.9 g/dL — ABNORMAL LOW (ref 12.0–15.0)
Immature Granulocytes: 0 %
Lymphocytes Relative: 36 %
Lymphs Abs: 3.4 10*3/uL (ref 0.7–4.0)
MCH: 25.8 pg — ABNORMAL LOW (ref 26.0–34.0)
MCHC: 31.3 g/dL (ref 30.0–36.0)
MCV: 82.3 fL (ref 80.0–100.0)
Monocytes Absolute: 0.8 10*3/uL (ref 0.1–1.0)
Monocytes Relative: 9 %
Neutro Abs: 4.5 10*3/uL (ref 1.7–7.7)
Neutrophils Relative %: 49 %
Platelets: 408 10*3/uL — ABNORMAL HIGH (ref 150–400)
RBC: 4.62 MIL/uL (ref 3.87–5.11)
RDW: 13.5 % (ref 11.5–15.5)
WBC: 9.3 10*3/uL (ref 4.0–10.5)
nRBC: 0 % (ref 0.0–0.2)

## 2020-10-19 LAB — FERRITIN: Ferritin: 27 ng/mL (ref 11–307)

## 2020-10-19 NOTE — Progress Notes (Signed)
[facility]  [address] [place], [place] [address] Phone: [phone]  Fax: [phone]   Clinic Day:  10/20/20  Referring physician: Allayne Stack, DO  Chief Complaint: Leah Solis is a 40 y.o. female with s/p gastric bypasswithiron deficiency anemia and B12 deficiency who is seen for 6 month assessment.  HPI: The patient was last seen in the hematology clinic on 04/13/2020. At that time, she felt good.  She noted fatigue during her menses. Exam was stable. Hematocrit was 37.9, hemoglobin 12.2, platelets 393,000, WBC 7,500. Ferritin was 104. She received a vitamin B12 injection.  Labs followed: 06/20/2020: Hematocrit was 37.4, hemoglobin 11.9, platelets 265,000, WBC 8,100. Ferritin 79. 08/16/2020: Hematocrit was 39.1, hemoglobin 12.2, platelets 441,000, WBC 8,100. Ferritin 69.  She received monthly vitamin B12 injections x 3 (05/16/2020 - 08/16/2020).  During the interim, she has been "not feeling her best." She woke this morning and was very tired. When she got out of the shower, she had to lay down before she was able to get dressed. She feels "not like her perky self." She works at [employer] She denies fever, chills, and sweats.   Her skin is dry and itchy, which happens when her iron is low. She has occasional sharp right shoulder pain. She is eating well. She denies any bleeding besides her periods. Her last period lasted 4-5 days and was heavy in the middle. She noticed a few clots.  She has gained weight lately but is not sure why. She is up 7 lbs today. She has not changed her diet or exercise habits.   Past Medical History:  Diagnosis Date  . Anemia   . Calcium deficiency     Past Surgical History:  Procedure Laterality Date  . BARIATRIC SURGERY    . CESAREAN SECTION      History reviewed. No pertinent family history.  Social History:  reports that she has never smoked. She has never used smokeless tobacco. No  history on file for alcohol use and drug use. She denies any known exposure to radiation or toxins. She lives in [place], [place]. 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  Allergies: No Known Allergies  Current Medications: Current Outpatient Medications  Medication Sig Dispense Refill  . docusate sodium (COLACE) 100 MG capsule Take 1 capsule (100 mg total) by mouth daily. (Patient not taking: No sig reported) 30 capsule 2  . ferrous gluconate (FERGON) 324 MG tablet Take 1 tablet (324 mg total) by mouth 2 (two) times daily with a meal. (Patient not taking: No sig reported) 60 tablet 2   No current facility-administered medications for this visit.    Review of Systems  Constitutional: Positive for malaise/fatigue. Negative for chills, diaphoresis, fever and weight loss (up 7 lbs).       Feels tired. Gaining weight unintentionally.  HENT: Negative.  Negative for congestion, ear discharge, ear pain, hearing loss, nosebleeds, sinus pain, sore throat and tinnitus.   Eyes: Negative.  Negative for blurred vision and double vision.  Respiratory: Negative.  Negative for cough, hemoptysis, sputum production and shortness of breath.   Cardiovascular: Negative.  Negative for chest pain, palpitations, orthopnea, leg swelling and PND.  Gastrointestinal: Negative for abdominal pain, blood in stool, constipation, diarrhea, heartburn, melena, nausea and vomiting.       Diet unchanged.  Genitourinary: Negative.  Negative for dysuria, frequency and urgency.  Musculoskeletal: Positive for joint pain (right shoulder, sharp, occasional).  Negative for back pain, myalgias and neck pain.  Skin: Positive for itching. Negative for rash.       Dry skin.  Neurological: Positive for weakness. Negative for dizziness, speech change, focal weakness and headaches.  Endo/Heme/Allergies: Negative for environmental allergies. Does not bruise/bleed easily.  Psychiatric/Behavioral: Negative.   Negative for depression and memory loss. The patient is not nervous/anxious and does not have insomnia.   All other systems reviewed and are negative.  Performance status (ECOG): 1  Vitals Blood pressure 102/60, pulse 73, temperature 97.6 F (36.4 C), temperature source Tympanic, resp. rate 16, weight 170 lb 3.1 oz (77.2 kg), SpO2 100 %.   Physical Exam Vitals and nursing note reviewed.  Constitutional:      General: She is not in acute distress.    Appearance: She is well-developed. She is not diaphoretic.  HENT:     Head: Normocephalic and atraumatic.     Mouth/Throat:     Mouth: Mucous membranes are moist.     Pharynx: Oropharynx is clear. No oropharyngeal exudate.  Eyes:     General: No scleral icterus.    Extraocular Movements: Extraocular movements intact.     Conjunctiva/sclera: Conjunctivae normal.     Pupils: Pupils are equal, round, and reactive to light.     Comments: Brown eyes.   Cardiovascular:     Rate and Rhythm: Normal rate and regular rhythm.     Heart sounds: Normal heart sounds. No murmur heard.   Pulmonary:     Effort: Pulmonary effort is normal. No respiratory distress.     Breath sounds: Normal breath sounds. No wheezing or rales.  Chest:     Chest wall: No tenderness.  Breasts:     Right: No axillary adenopathy or supraclavicular adenopathy.     Left: No axillary adenopathy or supraclavicular adenopathy.    Abdominal:     General: Bowel sounds are normal. There is no distension.     Palpations: Abdomen is soft. There is no hepatomegaly, splenomegaly or mass.     Tenderness: There is no abdominal tenderness. There is no guarding or rebound.  Musculoskeletal:        General: No swelling or tenderness. Normal range of motion.     Cervical back: Normal range of motion and neck supple.  Lymphadenopathy:     Head:     Right side of head: No preauricular, posterior auricular or occipital adenopathy.     Left side of head: No preauricular, posterior  auricular or occipital adenopathy.     Cervical: No cervical adenopathy.     Upper Body:     Right upper body: No supraclavicular or axillary adenopathy.     Left upper body: No supraclavicular or axillary adenopathy.     Lower Body: No right inguinal adenopathy. No left inguinal adenopathy.  Skin:    General: Skin is warm and dry.  Neurological:     Mental Status: She is alert and oriented to person, place, and time.  Psychiatric:        Behavior: Behavior normal.        Thought Content: Thought content normal.        Judgment: Judgment normal.    Appointment on 10/19/2020  Component Date Value Ref Range Status  . WBC 10/19/2020 9.3  4.0 - 10.5 K/uL Final  . RBC 10/19/2020 4.62  3.87 - 5.11 MIL/uL Final  . Hemoglobin 10/19/2020 11.9* 12.0 - 15.0 g/dL Final  . HCT 16/10/960401/09/2021 38.0  36.0 - 46.0 %  Final  . MCV 10/19/2020 82.3  80.0 - 100.0 fL Final  . MCH 10/19/2020 25.8* 26.0 - 34.0 pg Final  . MCHC 10/19/2020 31.3  30.0 - 36.0 g/dL Final  . RDW 74/82/7078 13.5  11.5 - 15.5 % Final  . Platelets 10/19/2020 408* 150 - 400 K/uL Final  . nRBC 10/19/2020 0.0  0.0 - 0.2 % Final  . Neutrophils Relative % 10/19/2020 49  % Final  . Neutro Abs 10/19/2020 4.5  1.7 - 7.7 K/uL Final  . Lymphocytes Relative 10/19/2020 36  % Final  . Lymphs Abs 10/19/2020 3.4  0.7 - 4.0 K/uL Final  . Monocytes Relative 10/19/2020 9  % Final  . Monocytes Absolute 10/19/2020 0.8  0.1 - 1.0 K/uL Final  . Eosinophils Relative 10/19/2020 6  % Final  . Eosinophils Absolute 10/19/2020 0.6* 0.0 - 0.5 K/uL Final  . Basophils Relative 10/19/2020 0  % Final  . Basophils Absolute 10/19/2020 0.0  0.0 - 0.1 K/uL Final  . Immature Granulocytes 10/19/2020 0  % Final  . Abs Immature Granulocytes 10/19/2020 0.03  0.00 - 0.07 K/uL Final   Performed at [facility], [address] [place], [place] [address]  . Ferritin 10/19/2020 27  11 - 307 ng/mL Final   Performed at [facility], [address] [place]  [place], [place] [address]    Assessment:  Leah Solis is a 40 y.o. female s/p gastric bypass surgery(2015) withiron deficiency anemia.She describes a history of iron deficiency for at least 20 years. She denies menses x 2-3 months. Oral ironcauses constipation. She has ice pica.   CBC on05/20/2020revealed a hematocrit 23.6, hemoglobin 6.0, MCV 56, platelets 451,000,andWBC 6400 (ANC 3400).Creatinine 0.6. Iron saturation was 2% with a TIBC of 529. B12 was 384. Folate was 14.7.  Work up on 03/11/2019 revealed hematocrit 26.5, hemoglobin 7.4, MCV 61.1, platelets 776,000, WBC 8,500. Ferritin was 5 with an iron saturation of 2% and a TIBC of 513.   She has received Venofer weekly x 4 (03/11/2019 - 04/01/2019), x 3 (06/05/2019 - 06/17/2019), and  x 3 (03/02/2020- 03/17/2020).  Ferritin has been followed: 5 on 03/11/2019, 53 on 04/21/2019, 15 on 06/03/2019, 74 on 09/08/2019, 6601/28/2021, 48 on 11/13/2019, 43 on 01/01/2020, 15 on 02/29/2020, 104 on 04/12/2020, 79 on 06/20/2020, 69 on 08/16/2020, and 27 on 10/19/2020.  She receives Venofer if ferritin is < 30.  She has B12 deficiency.  B12 was 273 on 09/08/2019.  She began monthly B12 on 09/09/2020 (last 08/16/2020).  Folate was 9.2 on 09/08/2019.  The patient has received both doses of the COVID-19 vaccine. She is getting her Booster next week.  Symptomatically,  she is "not feeling her best." She is very tired. She denies fever, chills, and sweats. Her last period lasted 4-5 days and was heavy in the middle. She noticed a few clots.  She has gained weight unexpectantly.  Exam is stable.  Plan: 1.   Review labs from 10/19/2020. 2.Iron deficiency anemia Symptomatically, she is fatigued today. Hematocrit 39.1.  Hemoglobin 12.2.  MCV 82.3 on 08/16/2020.                         Ferritin 69.             Hematocrit 38.0.  Hemoglobin 11.9.  MCV 82.3 on 10/19/2020.  Ferritin 27.              Ferritin goal 100.             Postpone Venofer for today. 3.B12 deficiency             B12 was 273 and folate 9.2 on 09/08/2019.             She receives B12 injections (last 08/16/2020).  Continue monthly B12. 4.   No IV iron today. 5.   Patient to get tested for COVID.  If negative, she is to return for:  Venofer weekly x 3 with pregnancy test. 6.   B12 monthly x 6. 7.   RTC in 3 months and 6 months (when she comes for B12) and labs (CBC and ferritin).   8.   RTC in 6 months for MD assessment, labs (CBC with diff, ferritin-day before) B12 and +/- Venofer.  I discussed the assessment and treatment plan with the patient.  The patient was provided an opportunity to ask questions and all were answered.  The patient agreed with the plan and demonstrated an understanding of the instructions.  The patient was advised to call back if the symptoms worsen or if the condition fails to improve as anticipated.   Rosey Bath, MD, PhD 10/20/2020, 5:00 PM  I, Danella Penton Tufford, am acting as Neurosurgeon for [employer]. Merlene Pulling, MD, PhD.  I, Lamya Lausch C. Merlene Pulling, MD, have reviewed the above documentation for accuracy and completeness, and I agree with the above.

## 2020-10-20 ENCOUNTER — Ambulatory Visit: Payer: 59 | Admitting: Hematology and Oncology

## 2020-10-20 ENCOUNTER — Encounter: Payer: Self-pay | Admitting: Hematology and Oncology

## 2020-10-20 ENCOUNTER — Ambulatory Visit: Payer: 59

## 2020-10-20 ENCOUNTER — Inpatient Hospital Stay: Payer: 59 | Admitting: Hematology and Oncology

## 2020-10-20 ENCOUNTER — Inpatient Hospital Stay: Payer: 59

## 2020-10-20 ENCOUNTER — Other Ambulatory Visit: Payer: 59

## 2020-10-20 VITALS — BP 102/60 | HR 73 | Temp 97.6°F | Resp 16 | Wt 170.2 lb

## 2020-10-20 DIAGNOSIS — D508 Other iron deficiency anemias: Secondary | ICD-10-CM

## 2020-10-20 DIAGNOSIS — D509 Iron deficiency anemia, unspecified: Secondary | ICD-10-CM | POA: Diagnosis not present

## 2020-10-20 DIAGNOSIS — E538 Deficiency of other specified B group vitamins: Secondary | ICD-10-CM | POA: Diagnosis not present

## 2020-10-20 DIAGNOSIS — K9589 Other complications of other bariatric procedure: Secondary | ICD-10-CM

## 2020-11-08 ENCOUNTER — Other Ambulatory Visit: Payer: Self-pay | Admitting: *Deleted

## 2020-11-08 ENCOUNTER — Inpatient Hospital Stay: Payer: 59 | Attending: Hematology and Oncology

## 2020-11-08 ENCOUNTER — Other Ambulatory Visit: Payer: Self-pay

## 2020-11-08 VITALS — BP 107/75 | HR 72 | Temp 96.6°F | Resp 18

## 2020-11-08 DIAGNOSIS — R5383 Other fatigue: Secondary | ICD-10-CM

## 2020-11-08 DIAGNOSIS — D509 Iron deficiency anemia, unspecified: Secondary | ICD-10-CM | POA: Insufficient documentation

## 2020-11-08 DIAGNOSIS — E538 Deficiency of other specified B group vitamins: Secondary | ICD-10-CM | POA: Insufficient documentation

## 2020-11-08 DIAGNOSIS — Z862 Personal history of diseases of the blood and blood-forming organs and certain disorders involving the immune mechanism: Secondary | ICD-10-CM

## 2020-11-08 DIAGNOSIS — K9589 Other complications of other bariatric procedure: Secondary | ICD-10-CM

## 2020-11-08 DIAGNOSIS — D508 Other iron deficiency anemias: Secondary | ICD-10-CM

## 2020-11-08 LAB — PREGNANCY, URINE: Preg Test, Ur: NEGATIVE

## 2020-11-08 MED ORDER — SODIUM CHLORIDE 0.9 % IV SOLN
INTRAVENOUS | Status: DC
  Filled 2020-11-08: qty 250

## 2020-11-08 MED ORDER — CYANOCOBALAMIN 1000 MCG/ML IJ SOLN
1000.0000 ug | Freq: Once | INTRAMUSCULAR | Status: AC
  Administered 2020-11-08: 1000 ug via INTRAMUSCULAR
  Filled 2020-11-08: qty 1

## 2020-11-08 MED ORDER — IRON SUCROSE 20 MG/ML IV SOLN
200.0000 mg | Freq: Once | INTRAVENOUS | Status: AC
  Administered 2020-11-08: 200 mg via INTRAVENOUS
  Filled 2020-11-08: qty 10

## 2020-11-08 NOTE — Progress Notes (Signed)
Pt received prescribed treatment in clinic, pt stable at d/c. Pt wishes to have thyroid lab tests on her next visit.

## 2020-11-14 ENCOUNTER — Other Ambulatory Visit: Payer: Self-pay

## 2020-11-14 ENCOUNTER — Telehealth: Payer: Self-pay | Admitting: Hematology and Oncology

## 2020-11-14 DIAGNOSIS — Z862 Personal history of diseases of the blood and blood-forming organs and certain disorders involving the immune mechanism: Secondary | ICD-10-CM

## 2020-11-14 NOTE — Telephone Encounter (Signed)
11/14/2020 Spoke w/ pt and informed her that per dr. Salena Saner, she needed venofer weekly x3 w/ urine pregnancy tests. She came in on 11/08/20 for one dose, so next two are scheduled for 11/17/20 and 11/24/20. Pt confirmed these appts  SRW

## 2020-11-17 ENCOUNTER — Inpatient Hospital Stay: Payer: 59

## 2020-11-17 ENCOUNTER — Other Ambulatory Visit: Payer: Self-pay

## 2020-11-17 VITALS — BP 115/66 | HR 75 | Temp 96.6°F | Resp 16

## 2020-11-17 DIAGNOSIS — N390 Urinary tract infection, site not specified: Secondary | ICD-10-CM

## 2020-11-17 DIAGNOSIS — D508 Other iron deficiency anemias: Secondary | ICD-10-CM

## 2020-11-17 DIAGNOSIS — D509 Iron deficiency anemia, unspecified: Secondary | ICD-10-CM | POA: Diagnosis not present

## 2020-11-17 DIAGNOSIS — Z862 Personal history of diseases of the blood and blood-forming organs and certain disorders involving the immune mechanism: Secondary | ICD-10-CM

## 2020-11-17 DIAGNOSIS — K9589 Other complications of other bariatric procedure: Secondary | ICD-10-CM

## 2020-11-17 LAB — FERRITIN: Ferritin: 110 ng/mL (ref 11–307)

## 2020-11-17 LAB — URINALYSIS, COMPLETE (UACMP) WITH MICROSCOPIC
Bilirubin Urine: NEGATIVE
Glucose, UA: NEGATIVE mg/dL
Hgb urine dipstick: NEGATIVE
Ketones, ur: NEGATIVE mg/dL
Leukocytes,Ua: NEGATIVE
Nitrite: POSITIVE — AB
Protein, ur: NEGATIVE mg/dL
Specific Gravity, Urine: 1.025 (ref 1.005–1.030)
pH: 7 (ref 5.0–8.0)

## 2020-11-17 LAB — CBC WITH DIFFERENTIAL/PLATELET
Abs Immature Granulocytes: 0.03 10*3/uL (ref 0.00–0.07)
Basophils Absolute: 0 10*3/uL (ref 0.0–0.1)
Basophils Relative: 0 %
Eosinophils Absolute: 0.5 10*3/uL (ref 0.0–0.5)
Eosinophils Relative: 8 %
HCT: 36.4 % (ref 36.0–46.0)
Hemoglobin: 11.7 g/dL — ABNORMAL LOW (ref 12.0–15.0)
Immature Granulocytes: 1 %
Lymphocytes Relative: 38 %
Lymphs Abs: 2.4 10*3/uL (ref 0.7–4.0)
MCH: 26.3 pg (ref 26.0–34.0)
MCHC: 32.1 g/dL (ref 30.0–36.0)
MCV: 81.8 fL (ref 80.0–100.0)
Monocytes Absolute: 0.5 10*3/uL (ref 0.1–1.0)
Monocytes Relative: 8 %
Neutro Abs: 2.9 10*3/uL (ref 1.7–7.7)
Neutrophils Relative %: 45 %
Platelets: 410 10*3/uL — ABNORMAL HIGH (ref 150–400)
RBC: 4.45 MIL/uL (ref 3.87–5.11)
RDW: 13.9 % (ref 11.5–15.5)
WBC: 6.3 10*3/uL (ref 4.0–10.5)
nRBC: 0 % (ref 0.0–0.2)

## 2020-11-17 LAB — PREGNANCY, URINE: Preg Test, Ur: NEGATIVE

## 2020-11-17 MED ORDER — IRON SUCROSE 20 MG/ML IV SOLN
200.0000 mg | Freq: Once | INTRAVENOUS | Status: DC
  Filled 2020-11-17: qty 10

## 2020-11-17 MED ORDER — SODIUM CHLORIDE 0.9 % IV SOLN
Freq: Once | INTRAVENOUS | Status: DC
  Filled 2020-11-17: qty 250

## 2020-11-17 NOTE — Progress Notes (Signed)
Unable to obtain IV access today. Patient will hydrate and reschedule for next week.

## 2020-11-20 ENCOUNTER — Telehealth: Payer: Self-pay | Admitting: Hematology and Oncology

## 2020-11-20 LAB — URINE CULTURE: Culture: >=100000 — AB

## 2020-11-20 NOTE — Telephone Encounter (Signed)
Re:  E coli UTI  Patient has UA and culture performed on 11/17/2020.  Urine culture + E coli and resistant to ampicillin and ampicillin-sulbactam.  E coli sensitive to nitrofurantoin, cefazolin, cefepime, ceftriaxone, ciprofloxacin, gentamicin, imipenem, pip/tazo, and Septra.  Patient seen by Dr Saul Fordyce Jose-Mathews on 11/18/2020.  She was prescribed Macrobid BID x 7 days.   Rosey Bath, MD

## 2020-11-21 ENCOUNTER — Other Ambulatory Visit: Payer: Self-pay

## 2020-11-21 ENCOUNTER — Inpatient Hospital Stay: Payer: 59

## 2020-11-21 VITALS — BP 103/60 | HR 76 | Temp 96.7°F | Resp 18

## 2020-11-21 DIAGNOSIS — K9589 Other complications of other bariatric procedure: Secondary | ICD-10-CM

## 2020-11-21 DIAGNOSIS — D509 Iron deficiency anemia, unspecified: Secondary | ICD-10-CM | POA: Diagnosis not present

## 2020-11-21 DIAGNOSIS — Z862 Personal history of diseases of the blood and blood-forming organs and certain disorders involving the immune mechanism: Secondary | ICD-10-CM

## 2020-11-21 LAB — PREGNANCY, URINE: Preg Test, Ur: NEGATIVE

## 2020-11-21 MED ORDER — IRON SUCROSE 20 MG/ML IV SOLN
200.0000 mg | Freq: Once | INTRAVENOUS | Status: AC
  Administered 2020-11-21: 200 mg via INTRAVENOUS
  Filled 2020-11-21: qty 10

## 2020-11-21 MED ORDER — SODIUM CHLORIDE 0.9 % IV SOLN
Freq: Once | INTRAVENOUS | Status: AC
  Filled 2020-11-21: qty 250

## 2020-11-21 NOTE — Progress Notes (Signed)
Patient received treatment in clinic.  Stable at discharge. 

## 2020-11-24 ENCOUNTER — Inpatient Hospital Stay: Payer: 59

## 2020-11-24 ENCOUNTER — Other Ambulatory Visit: Payer: Self-pay

## 2020-11-24 ENCOUNTER — Other Ambulatory Visit: Payer: 59

## 2020-11-24 ENCOUNTER — Ambulatory Visit: Payer: 59

## 2020-11-24 DIAGNOSIS — K9589 Other complications of other bariatric procedure: Secondary | ICD-10-CM

## 2020-11-28 ENCOUNTER — Other Ambulatory Visit: Payer: Self-pay

## 2020-11-28 ENCOUNTER — Inpatient Hospital Stay: Payer: 59

## 2020-11-28 VITALS — BP 106/83 | HR 81 | Temp 97.0°F | Resp 18

## 2020-11-28 DIAGNOSIS — K9589 Other complications of other bariatric procedure: Secondary | ICD-10-CM

## 2020-11-28 DIAGNOSIS — D509 Iron deficiency anemia, unspecified: Secondary | ICD-10-CM | POA: Diagnosis not present

## 2020-11-28 DIAGNOSIS — D508 Other iron deficiency anemias: Secondary | ICD-10-CM

## 2020-11-28 LAB — PREGNANCY, URINE: Preg Test, Ur: NEGATIVE

## 2020-11-28 MED ORDER — IRON SUCROSE 20 MG/ML IV SOLN
200.0000 mg | Freq: Once | INTRAVENOUS | Status: AC
  Administered 2020-11-28: 200 mg via INTRAVENOUS
  Filled 2020-11-28: qty 10

## 2020-11-28 MED ORDER — SODIUM CHLORIDE 0.9 % IV SOLN
Freq: Once | INTRAVENOUS | Status: AC
  Filled 2020-11-28: qty 250

## 2020-12-22 ENCOUNTER — Inpatient Hospital Stay: Payer: 59 | Attending: Hematology and Oncology

## 2020-12-22 ENCOUNTER — Inpatient Hospital Stay: Payer: 59

## 2020-12-22 ENCOUNTER — Other Ambulatory Visit: Payer: Self-pay

## 2020-12-22 DIAGNOSIS — R635 Abnormal weight gain: Secondary | ICD-10-CM | POA: Diagnosis not present

## 2020-12-22 DIAGNOSIS — D508 Other iron deficiency anemias: Secondary | ICD-10-CM

## 2020-12-22 DIAGNOSIS — E538 Deficiency of other specified B group vitamins: Secondary | ICD-10-CM | POA: Diagnosis present

## 2020-12-22 DIAGNOSIS — R5383 Other fatigue: Secondary | ICD-10-CM | POA: Diagnosis not present

## 2020-12-22 DIAGNOSIS — K9589 Other complications of other bariatric procedure: Secondary | ICD-10-CM

## 2020-12-22 DIAGNOSIS — Z862 Personal history of diseases of the blood and blood-forming organs and certain disorders involving the immune mechanism: Secondary | ICD-10-CM

## 2020-12-22 LAB — TSH: TSH: 1.036 u[IU]/mL (ref 0.350–4.500)

## 2020-12-22 LAB — T4, FREE: Free T4: 0.72 ng/dL (ref 0.61–1.12)

## 2020-12-22 MED ORDER — CYANOCOBALAMIN 1000 MCG/ML IJ SOLN
1000.0000 ug | Freq: Once | INTRAMUSCULAR | Status: AC
  Administered 2020-12-22: 1000 ug via INTRAMUSCULAR
  Filled 2020-12-22: qty 1

## 2021-01-26 ENCOUNTER — Inpatient Hospital Stay: Payer: 59

## 2021-01-26 ENCOUNTER — Inpatient Hospital Stay: Payer: 59 | Attending: Hematology and Oncology

## 2021-01-26 ENCOUNTER — Other Ambulatory Visit: Payer: Self-pay

## 2021-01-26 DIAGNOSIS — K9589 Other complications of other bariatric procedure: Secondary | ICD-10-CM

## 2021-01-26 DIAGNOSIS — D508 Other iron deficiency anemias: Secondary | ICD-10-CM

## 2021-01-26 DIAGNOSIS — E538 Deficiency of other specified B group vitamins: Secondary | ICD-10-CM | POA: Diagnosis not present

## 2021-01-26 LAB — CBC
HCT: 39.4 % (ref 36.0–46.0)
Hemoglobin: 12.6 g/dL (ref 12.0–15.0)
MCH: 26.4 pg (ref 26.0–34.0)
MCHC: 32 g/dL (ref 30.0–36.0)
MCV: 82.6 fL (ref 80.0–100.0)
Platelets: 412 10*3/uL — ABNORMAL HIGH (ref 150–400)
RBC: 4.77 MIL/uL (ref 3.87–5.11)
RDW: 13.6 % (ref 11.5–15.5)
WBC: 7.8 10*3/uL (ref 4.0–10.5)
nRBC: 0 % (ref 0.0–0.2)

## 2021-01-26 LAB — FERRITIN: Ferritin: 118 ng/mL (ref 11–307)

## 2021-01-26 MED ORDER — CYANOCOBALAMIN 1000 MCG/ML IJ SOLN
1000.0000 ug | Freq: Once | INTRAMUSCULAR | Status: AC
  Administered 2021-01-26: 1000 ug via INTRAMUSCULAR
  Filled 2021-01-26: qty 1

## 2021-02-06 ENCOUNTER — Other Ambulatory Visit: Payer: Self-pay | Admitting: Family Medicine

## 2021-02-06 DIAGNOSIS — Z1231 Encounter for screening mammogram for malignant neoplasm of breast: Secondary | ICD-10-CM

## 2021-02-13 ENCOUNTER — Ambulatory Visit
Admission: RE | Admit: 2021-02-13 | Discharge: 2021-02-13 | Disposition: A | Discharge status: Home / Self Care | Payer: 59 | Source: Ambulatory Visit | Attending: Family Medicine | Admitting: Family Medicine

## 2021-02-13 ENCOUNTER — Other Ambulatory Visit: Payer: Self-pay

## 2021-02-13 DIAGNOSIS — Z1231 Encounter for screening mammogram for malignant neoplasm of breast: Secondary | ICD-10-CM | POA: Insufficient documentation

## 2021-02-23 ENCOUNTER — Inpatient Hospital Stay: Payer: 59 | Attending: Hematology and Oncology

## 2021-02-23 ENCOUNTER — Other Ambulatory Visit: Payer: Self-pay

## 2021-02-23 DIAGNOSIS — K9589 Other complications of other bariatric procedure: Secondary | ICD-10-CM

## 2021-02-23 DIAGNOSIS — E538 Deficiency of other specified B group vitamins: Secondary | ICD-10-CM | POA: Diagnosis not present

## 2021-02-23 DIAGNOSIS — D508 Other iron deficiency anemias: Secondary | ICD-10-CM

## 2021-02-23 MED ORDER — CYANOCOBALAMIN 1000 MCG/ML IJ SOLN
1000.0000 ug | Freq: Once | INTRAMUSCULAR | Status: AC
  Administered 2021-02-23: 1000 ug via INTRAMUSCULAR
  Filled 2021-02-23: qty 1

## 2021-03-23 ENCOUNTER — Inpatient Hospital Stay: Payer: 59 | Attending: Hematology and Oncology

## 2021-04-17 ENCOUNTER — Other Ambulatory Visit: Payer: Self-pay

## 2021-04-17 DIAGNOSIS — K9589 Other complications of other bariatric procedure: Secondary | ICD-10-CM

## 2021-04-18 ENCOUNTER — Inpatient Hospital Stay: Payer: 59 | Attending: Oncology

## 2021-04-18 ENCOUNTER — Other Ambulatory Visit: Payer: Self-pay

## 2021-04-18 ENCOUNTER — Other Ambulatory Visit: Payer: 59

## 2021-04-18 DIAGNOSIS — D509 Iron deficiency anemia, unspecified: Secondary | ICD-10-CM | POA: Diagnosis not present

## 2021-04-18 DIAGNOSIS — E538 Deficiency of other specified B group vitamins: Secondary | ICD-10-CM | POA: Insufficient documentation

## 2021-04-18 DIAGNOSIS — K9589 Other complications of other bariatric procedure: Secondary | ICD-10-CM

## 2021-04-18 DIAGNOSIS — Z9884 Bariatric surgery status: Secondary | ICD-10-CM | POA: Insufficient documentation

## 2021-04-18 LAB — CBC WITH DIFFERENTIAL/PLATELET
Abs Immature Granulocytes: 0.02 10*3/uL (ref 0.00–0.07)
Basophils Absolute: 0.1 10*3/uL (ref 0.0–0.1)
Basophils Relative: 1 %
Eosinophils Absolute: 0.5 10*3/uL (ref 0.0–0.5)
Eosinophils Relative: 5 %
HCT: 37.9 % (ref 36.0–46.0)
Hemoglobin: 12.2 g/dL (ref 12.0–15.0)
Immature Granulocytes: 0 %
Lymphocytes Relative: 32 %
Lymphs Abs: 2.7 10*3/uL (ref 0.7–4.0)
MCH: 26.3 pg (ref 26.0–34.0)
MCHC: 32.2 g/dL (ref 30.0–36.0)
MCV: 81.7 fL (ref 80.0–100.0)
Monocytes Absolute: 0.6 10*3/uL (ref 0.1–1.0)
Monocytes Relative: 8 %
Neutro Abs: 4.6 10*3/uL (ref 1.7–7.7)
Neutrophils Relative %: 54 %
Platelets: 413 10*3/uL — ABNORMAL HIGH (ref 150–400)
RBC: 4.64 MIL/uL (ref 3.87–5.11)
RDW: 13.1 % (ref 11.5–15.5)
WBC: 8.4 10*3/uL (ref 4.0–10.5)
nRBC: 0 % (ref 0.0–0.2)

## 2021-04-18 LAB — FERRITIN: Ferritin: 62 ng/mL (ref 11–307)

## 2021-04-19 ENCOUNTER — Encounter: Payer: Self-pay | Admitting: Oncology

## 2021-04-19 ENCOUNTER — Inpatient Hospital Stay (HOSPITAL_BASED_OUTPATIENT_CLINIC_OR_DEPARTMENT_OTHER): Payer: 59 | Admitting: Oncology

## 2021-04-19 ENCOUNTER — Inpatient Hospital Stay: Payer: 59

## 2021-04-19 VITALS — BP 111/73 | HR 87 | Temp 98.4°F | Resp 16 | Wt 175.7 lb

## 2021-04-19 DIAGNOSIS — E538 Deficiency of other specified B group vitamins: Secondary | ICD-10-CM

## 2021-04-19 DIAGNOSIS — D508 Other iron deficiency anemias: Secondary | ICD-10-CM | POA: Diagnosis not present

## 2021-04-19 DIAGNOSIS — D509 Iron deficiency anemia, unspecified: Secondary | ICD-10-CM | POA: Diagnosis not present

## 2021-04-19 DIAGNOSIS — Z9884 Bariatric surgery status: Secondary | ICD-10-CM | POA: Diagnosis not present

## 2021-04-19 DIAGNOSIS — K9589 Other complications of other bariatric procedure: Secondary | ICD-10-CM

## 2021-04-19 NOTE — Progress Notes (Signed)
Pt is concerned because she had gastric surgery and is steady gaining weight. As well as, concerned about high platelets, looked up side effects on the internet and states she has been feeling numbness in the leg. Also, believes she does not need b12 inj because she has been feeling okay.

## 2021-04-19 NOTE — Progress Notes (Signed)
Hematology/Oncology Consult note [facility]  [phone] [phone]  Patient Care Team: Patriciaann Clan, DO as PCP - General (Family Medicine)   Name of the patient: Leah Solis  [id number]  06-21-1981   Date of visit: 04/19/21  Diagnosis-history of iron and B12 deficiency anemia  Chief complaint/ Reason for visit-routine follow-up of B12 and iron deficiency anemia  Heme/Onc history: Patient is a 40 year old African-American female with history of iron and B12 deficiency anemia attributed to gastric bypass surgery in 2015.  She is unable to tolerate oral iron and has received IV iron in the past.  She has been on monthly B12 injections.  Interval history-presently patient feels well and denies any specific complaints at this time.  Menstrual cycles have been regular but can sometimes be heavy.  ECOG PS- 0 Pain scale- 0   Review of systems- Review of Systems  Constitutional:  Negative for chills, fever, malaise/fatigue and weight loss.  HENT:  Negative for congestion, ear discharge and nosebleeds.   Eyes:  Negative for blurred vision.  Respiratory:  Negative for cough, hemoptysis, sputum production, shortness of breath and wheezing.   Cardiovascular:  Negative for chest pain, palpitations, orthopnea and claudication.  Gastrointestinal:  Negative for abdominal pain, blood in stool, constipation, diarrhea, heartburn, melena, nausea and vomiting.  Genitourinary:  Negative for dysuria, flank pain, frequency, hematuria and urgency.  Musculoskeletal:  Negative for back pain, joint pain and myalgias.  Skin:  Negative for rash.  Neurological:  Negative for dizziness, tingling, focal weakness, seizures, weakness and headaches.  Endo/Heme/Allergies:  Does not bruise/bleed easily.  Psychiatric/Behavioral:  Negative for depression and suicidal ideas. The patient does not have insomnia.      No Known Allergies   Past Medical History:   Diagnosis Date   Anemia    Calcium deficiency      Past Surgical History:  Procedure Laterality Date   BARIATRIC SURGERY     CESAREAN SECTION      Social History   Socioeconomic History   Marital status: Divorced    Spouse name: Not on file   Number of children: Not on file   Years of education: Not on file   Highest education level: Not on file  Occupational History   Not on file  Tobacco Use   Smoking status: Never   Smokeless tobacco: Never  Substance and Sexual Activity   Alcohol use: Not on file   Drug use: Not on file   Sexual activity: Not on file  Other Topics Concern   Not on file  Social History Narrative   Not on file   Social Determinants of Health   Financial Resource Strain: Not on file  Food Insecurity: Not on file  Transportation Needs: Not on file  Physical Activity: Not on file  Stress: Not on file  Social Connections: Not on file  Intimate Partner Violence: Not on file    Family History  Problem Relation Age of Onset   Breast cancer Neg Hx      Current Outpatient Medications:    cetirizine (ZYRTEC) 10 MG tablet, Take 1 tablet by mouth daily., Disp: , Rfl:    COVID-19 Specimen Collection KIT, See admin instructions. for testing (Patient not taking: Reported on 04/19/2021), Disp: , Rfl:    diclofenac Sodium (VOLTAREN) 1 % GEL, Apply 2 g topically 4 (four) times daily. (Patient not taking: Reported on 04/19/2021), Disp: , Rfl:    docusate sodium (COLACE) 100 MG capsule,  Take 1 capsule (100 mg total) by mouth daily. (Patient not taking: No sig reported), Disp: 30 capsule, Rfl: 2   ferrous gluconate (FERGON) 324 MG tablet, Take 1 tablet (324 mg total) by mouth 2 (two) times daily with a meal. (Patient not taking: No sig reported), Disp: 60 tablet, Rfl: 2   fluconazole (DIFLUCAN) 150 MG tablet, Take 150 mg by mouth once. (Patient not taking: Reported on 04/19/2021), Disp: , Rfl:    nitrofurantoin, macrocrystal-monohydrate, (MACROBID) 100 MG  capsule, Take 100 mg by mouth 2 (two) times daily. (Patient not taking: Reported on 04/19/2021), Disp: , Rfl:   Physical exam:  Vitals:   04/19/21 1309  BP: 111/73  Pulse: 87  Resp: 16  Temp: 98.4 F (36.9 C)  SpO2: 100%  Weight: 175 lb 11.3 oz (79.7 kg)   Physical Exam Constitutional:      General: She is not in acute distress. Cardiovascular:     Rate and Rhythm: Normal rate and regular rhythm.     Heart sounds: Normal heart sounds.  Pulmonary:     Effort: Pulmonary effort is normal.     Breath sounds: Normal breath sounds.  Abdominal:     General: Bowel sounds are normal.     Palpations: Abdomen is soft.  Skin:    General: Skin is warm and dry.  Neurological:     Mental Status: She is alert and oriented to person, place, and time.     CMP Latest Ref Rng & Units 01/09/2017  Glucose 65 - 99 mg/dL 75  BUN 6 - 20 mg/dL 9  Creatinine 0.57 - 1.00 mg/dL 0.62  Sodium 134 - 144 mmol/L 141  Potassium 3.5 - 5.2 mmol/L 3.9  Chloride 96 - 106 mmol/L 101  CO2 18 - 29 mmol/L 26  Calcium 8.7 - 10.2 mg/dL 8.7   CBC Latest Ref Rng & Units 04/18/2021  WBC 4.0 - 10.5 K/uL 8.4  Hemoglobin 12.0 - 15.0 g/dL 12.2  Hematocrit 36.0 - 46.0 % 37.9  Platelets 150 - 400 K/uL 413(H)      Assessment and plan- Patient is a 40 y.o. female with history of iron and B12 deficiency anemia here for routine follow-up  Ferritin levels are 62 today and patient is not anemic.  She does not require any IV iron at this time.  Patient was getting B12 shots here on a monthly basis but would like to get a break from the B12 injections at this time.  She will try taking oral B12.  Repeat CBC ferritin and iron studies and B12 in 3 in 6 months and I will see her back in 6 months.  She will call us sooner if she has any questions or concerns   Visit Diagnosis 1. Iron deficiency anemia following bariatric surgery   2. History of gastric bypass   3. B12 deficiency      Dr. Randa Evens, MD, MPH [facility] at  [facility] [phone] 04/19/2021 3:17 PM

## 2021-04-27 ENCOUNTER — Inpatient Hospital Stay: Payer: 59

## 2021-07-20 ENCOUNTER — Inpatient Hospital Stay: Payer: 59 | Attending: Oncology

## 2021-07-20 ENCOUNTER — Other Ambulatory Visit: Payer: Self-pay

## 2021-07-20 ENCOUNTER — Inpatient Hospital Stay: Payer: 59

## 2021-07-20 DIAGNOSIS — E538 Deficiency of other specified B group vitamins: Secondary | ICD-10-CM | POA: Insufficient documentation

## 2021-07-20 DIAGNOSIS — D509 Iron deficiency anemia, unspecified: Secondary | ICD-10-CM | POA: Insufficient documentation

## 2021-07-20 DIAGNOSIS — K9589 Other complications of other bariatric procedure: Secondary | ICD-10-CM

## 2021-07-20 LAB — CBC
HCT: 38.2 % (ref 36.0–46.0)
Hemoglobin: 12.3 g/dL (ref 12.0–15.0)
MCH: 25.9 pg — ABNORMAL LOW (ref 26.0–34.0)
MCHC: 32.2 g/dL (ref 30.0–36.0)
MCV: 80.6 fL (ref 80.0–100.0)
Platelets: 415 10*3/uL — ABNORMAL HIGH (ref 150–400)
RBC: 4.74 MIL/uL (ref 3.87–5.11)
RDW: 13.3 % (ref 11.5–15.5)
WBC: 8.5 10*3/uL (ref 4.0–10.5)
nRBC: 0 % (ref 0.0–0.2)

## 2021-07-20 LAB — FERRITIN: Ferritin: 32 ng/mL (ref 11–307)

## 2021-07-20 LAB — IRON AND TIBC
Iron: 95 ug/dL (ref 28–170)
Saturation Ratios: 27 % (ref 10.4–31.8)
TIBC: 349 ug/dL (ref 250–450)
UIBC: 254 ug/dL

## 2021-07-20 LAB — VITAMIN B12: Vitamin B-12: 315 pg/mL (ref 180–914)

## 2021-07-20 MED ORDER — CYANOCOBALAMIN 1000 MCG/ML IJ SOLN
1000.0000 ug | Freq: Once | INTRAMUSCULAR | Status: AC
  Administered 2021-07-20: 1000 ug via INTRAMUSCULAR

## 2021-07-31 ENCOUNTER — Encounter: Payer: Self-pay | Admitting: Oncology

## 2021-08-01 ENCOUNTER — Telehealth: Payer: Self-pay | Admitting: *Deleted

## 2021-08-01 ENCOUNTER — Other Ambulatory Visit: Payer: Self-pay | Admitting: Oncology

## 2021-08-01 NOTE — Telephone Encounter (Signed)
Her iron numbers are not bad. We can give her 2 venofers instead of 5 if she is symptomatic. Keep future appts as scheduled

## 2021-08-01 NOTE — Telephone Encounter (Signed)
Dr Smith Robert had wanted pt to get 2 doses of venofer and had been contacted but left message only. I tried again today and got voicemail and left message about the appts and if they are good for her and she is acceptable. Left phone number for her to call back if needed

## 2021-08-04 ENCOUNTER — Other Ambulatory Visit: Payer: Self-pay

## 2021-08-04 ENCOUNTER — Inpatient Hospital Stay: Payer: 59

## 2021-08-04 VITALS — BP 109/74 | HR 73 | Temp 96.3°F | Resp 16

## 2021-08-04 DIAGNOSIS — K9589 Other complications of other bariatric procedure: Secondary | ICD-10-CM

## 2021-08-04 DIAGNOSIS — D509 Iron deficiency anemia, unspecified: Secondary | ICD-10-CM | POA: Diagnosis not present

## 2021-08-04 MED ORDER — SODIUM CHLORIDE 0.9 % IV SOLN
Freq: Once | INTRAVENOUS | Status: AC
  Filled 2021-08-04: qty 250

## 2021-08-04 MED ORDER — IRON SUCROSE 20 MG/ML IV SOLN
200.0000 mg | INTRAVENOUS | Status: DC
  Administered 2021-08-04: 200 mg via INTRAVENOUS
  Filled 2021-08-04: qty 10

## 2021-08-04 NOTE — Patient Instructions (Signed)

## 2021-08-17 ENCOUNTER — Inpatient Hospital Stay: Payer: 59 | Attending: Oncology

## 2021-08-17 ENCOUNTER — Other Ambulatory Visit: Payer: Self-pay

## 2021-08-17 VITALS — BP 117/76 | HR 71 | Temp 98.4°F

## 2021-08-17 DIAGNOSIS — D509 Iron deficiency anemia, unspecified: Secondary | ICD-10-CM | POA: Insufficient documentation

## 2021-08-17 DIAGNOSIS — D508 Other iron deficiency anemias: Secondary | ICD-10-CM

## 2021-08-17 MED ORDER — IRON SUCROSE 20 MG/ML IV SOLN
200.0000 mg | INTRAVENOUS | Status: DC
  Administered 2021-08-17: 200 mg via INTRAVENOUS
  Filled 2021-08-17: qty 10

## 2021-08-17 MED ORDER — SODIUM CHLORIDE 0.9 % IV SOLN
INTRAVENOUS | Status: DC
  Filled 2021-08-17: qty 250

## 2021-10-12 ENCOUNTER — Other Ambulatory Visit: Payer: Self-pay | Admitting: *Deleted

## 2021-10-12 DIAGNOSIS — K9589 Other complications of other bariatric procedure: Secondary | ICD-10-CM

## 2021-10-12 DIAGNOSIS — D508 Other iron deficiency anemias: Secondary | ICD-10-CM

## 2021-10-18 ENCOUNTER — Inpatient Hospital Stay: Payer: 59 | Admitting: Oncology

## 2021-10-18 ENCOUNTER — Inpatient Hospital Stay: Payer: 59

## 2021-10-18 ENCOUNTER — Ambulatory Visit: Payer: 59 | Admitting: Oncology

## 2021-10-18 ENCOUNTER — Other Ambulatory Visit: Payer: 59

## 2021-10-20 ENCOUNTER — Inpatient Hospital Stay: Payer: 59 | Admitting: Nurse Practitioner

## 2021-10-20 ENCOUNTER — Inpatient Hospital Stay: Payer: 59

## 2021-11-02 ENCOUNTER — Inpatient Hospital Stay (HOSPITAL_BASED_OUTPATIENT_CLINIC_OR_DEPARTMENT_OTHER): Payer: 59 | Admitting: Nurse Practitioner

## 2021-11-02 ENCOUNTER — Other Ambulatory Visit: Payer: Self-pay

## 2021-11-02 ENCOUNTER — Encounter: Payer: Self-pay | Admitting: Nurse Practitioner

## 2021-11-02 ENCOUNTER — Inpatient Hospital Stay: Payer: 59 | Attending: Oncology

## 2021-11-02 VITALS — BP 131/89 | HR 92 | Temp 97.4°F | Wt 181.2 lb

## 2021-11-02 DIAGNOSIS — Z9884 Bariatric surgery status: Secondary | ICD-10-CM | POA: Diagnosis not present

## 2021-11-02 DIAGNOSIS — K9589 Other complications of other bariatric procedure: Secondary | ICD-10-CM

## 2021-11-02 DIAGNOSIS — D508 Other iron deficiency anemias: Secondary | ICD-10-CM

## 2021-11-02 DIAGNOSIS — E538 Deficiency of other specified B group vitamins: Secondary | ICD-10-CM

## 2021-11-02 DIAGNOSIS — D509 Iron deficiency anemia, unspecified: Secondary | ICD-10-CM | POA: Diagnosis not present

## 2021-11-02 LAB — CBC
HCT: 36.6 % (ref 36.0–46.0)
Hemoglobin: 11.6 g/dL — ABNORMAL LOW (ref 12.0–15.0)
MCH: 26.1 pg (ref 26.0–34.0)
MCHC: 31.7 g/dL (ref 30.0–36.0)
MCV: 82.4 fL (ref 80.0–100.0)
Platelets: 450 10*3/uL — ABNORMAL HIGH (ref 150–400)
RBC: 4.44 MIL/uL (ref 3.87–5.11)
RDW: 13.7 % (ref 11.5–15.5)
WBC: 9 10*3/uL (ref 4.0–10.5)
nRBC: 0 % (ref 0.0–0.2)

## 2021-11-02 LAB — VITAMIN B12: Vitamin B-12: 261 pg/mL (ref 180–914)

## 2021-11-02 LAB — IRON AND TIBC
Iron: 43 ug/dL (ref 28–170)
Saturation Ratios: 14 % (ref 10.4–31.8)
TIBC: 314 ug/dL (ref 250–450)
UIBC: 271 ug/dL

## 2021-11-02 LAB — FERRITIN: Ferritin: 59 ng/mL (ref 11–307)

## 2021-11-02 NOTE — Progress Notes (Signed)
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  She is unable to tolerate oral iron and has received IV iron in the past.  She has been on monthly B12 injections. ° °Interval history-presently patient feels well and denies any specific complaints at this time.  Menstrual cycles have been regular but can sometimes be heavy. She is interested in having a tummy tuck and her surgeon has recommended a hemoglobin of 12 prior to surgery.  ° °ECOG PS- 0 °Pain scale- 0 ° ° °Review of systems- Review of Systems  °Constitutional:  Negative for chills, fever, malaise/fatigue and weight loss.  °HENT:  Negative for congestion, ear discharge and nosebleeds.   °Eyes:  Negative for blurred vision.  °Respiratory:  Negative for cough, hemoptysis, sputum production, shortness of breath and wheezing.   °Cardiovascular:  Negative for chest pain, palpitations, orthopnea and claudication.  °Gastrointestinal:  Negative for abdominal pain, blood in stool, constipation, diarrhea, heartburn, melena, nausea and vomiting.  °Genitourinary:  Negative for dysuria, flank pain, frequency, hematuria and urgency.  °Musculoskeletal:  Negative for back pain, joint pain and myalgias.  °Skin:  Negative for rash.  °Neurological:  Negative for dizziness, tingling, focal weakness, seizures, weakness and headaches.  °Endo/Heme/Allergies:  Does not bruise/bleed easily.  °Psychiatric/Behavioral:  Negative for depression  and suicidal ideas. The patient does not have insomnia.    ° ° °No Known Allergies ° ° °Past Medical History:  °Diagnosis Date  ° Anemia   ° Calcium deficiency   ° ° ° °Past Surgical History:  °Procedure Laterality Date  ° BARIATRIC SURGERY    ° CESAREAN SECTION    ° ° °Social History  ° °Socioeconomic History  ° Marital status: Divorced  °  Spouse name: Not on file  ° Number of children: Not on file  ° Years of education: Not on file  ° Highest education level: Not on file  °Occupational History  ° Not on file  °Tobacco Use  ° Smoking status: Never  ° Smokeless tobacco: Never  °Substance and Sexual Activity  ° Alcohol use: Not on file  ° Drug use: Not on file  ° Sexual activity: Not on file  °Other Topics Concern  ° Not on file  °Social History Narrative  ° Not on file  ° °Social Determinants of Health  ° °Financial Resource Strain: Not on file  °Food Insecurity: Not on file  °Transportation Needs: Not on file  °Physical Activity: Not on file  °Stress: Not on file  °Social Connections: Not on file  °Intimate Partner Violence: Not on file  ° ° °Family History  °Problem Relation Age of Onset  ° Breast cancer Neg Hx   ° ° ° °Current Outpatient Medications:  °  cetirizine (ZYRTEC) 10 MG tablet, Take 1 tablet by mouth daily., Disp: , Rfl:  °  COVID-19 Specimen Collection KIT, See admin instructions. for testing (Patient not taking: Reported on 04/19/2021), Disp: , Rfl:  °  diclofenac Sodium (VOLTAREN) 1 % GEL, Apply 2 g topically 4 (four) times daily. (  Patient not taking: Reported on 04/19/2021), Disp: , Rfl:  °  docusate sodium (COLACE) 100 MG capsule, Take 1 capsule (100 mg total) by mouth daily. (Patient not taking: No sig reported), Disp: 30 capsule, Rfl: 2 °  ferrous gluconate (FERGON) 324 MG tablet, Take 1 tablet (324 mg total) by mouth 2 (two) times daily with a meal. (Patient not taking: No sig reported), Disp: 60 tablet, Rfl: 2 °  fluconazole (DIFLUCAN) 150 MG tablet, Take 150 mg by mouth once. (Patient not  taking: Reported on 04/19/2021), Disp: , Rfl:  °  nitrofurantoin, macrocrystal-monohydrate, (MACROBID) 100 MG capsule, Take 100 mg by mouth 2 (two) times daily. (Patient not taking: Reported on 04/19/2021), Disp: , Rfl:  ° °Physical exam:  °Vitals:  ° 11/02/21 1506  °BP: 131/89  °Pulse: 92  °Temp: (!) 97.4 °F (36.3 °C)  °TempSrc: Tympanic  °SpO2: 100%  °Weight: 181 lb 3.2 oz (82.2 kg)  ° °Physical Exam °Constitutional:   °   General: She is not in acute distress. °Cardiovascular:  °   Rate and Rhythm: Normal rate and regular rhythm.  °   Heart sounds: Normal heart sounds.  °Pulmonary:  °   Effort: Pulmonary effort is normal.  °   Breath sounds: Normal breath sounds.  °Abdominal:  °   General: Bowel sounds are normal.  °   Palpations: Abdomen is soft.  °Skin: °   General: Skin is warm and dry.  °Neurological:  °   Mental Status: She is alert and oriented to person, place, and time.  °  ° °CMP Latest Ref Rng & Units 01/09/2017  °Glucose 65 - 99 mg/dL 75  °BUN 6 - 20 mg/dL 9  °Creatinine 0.57 - 1.00 mg/dL 0.62  °Sodium 134 - 144 mmol/L 141  °Potassium 3.5 - 5.2 mmol/L 3.9  °Chloride 96 - 106 mmol/L 101  °CO2 18 - 29 mmol/L 26  °Calcium 8.7 - 10.2 mg/dL 8.7  ° °CBC Latest Ref Rng & Units 07/20/2021  °WBC 4.0 - 10.5 K/uL 8.5  °Hemoglobin 12.0 - 15.0 g/dL 12.3  °Hematocrit 36.0 - 46.0 % 38.2  °Platelets 150 - 400 K/uL 415(H)  ° ° ° ° °Assessment and plan- Patient is a 40 y.o. female with history of iron and B12 deficiency anemia here for routine follow-up ° °Iron deficiency anemia- secondary to menses and malabsorption secondary to gastric bypass. Hemoglobin has dropped to 11.6, normoctic. Platelets elevated which we discussed can occur with iron deficiency. Iron studies pending at time of visit. Last venofer 08/17/21. Ferritin is 59, iron sat 14%. No indication for IV iron at this time. Monitor.  ° °B12 Deficiency- on oral b12 though I question if she'll be able to absorb this given her history of gastric bypass. B12 pending  at this time. Last injection 07/20/21. B12 is 261. Goal > 500. Recommend starting monthly b12 injections.  ° °Abdominoplasty- elective. Goal hemoglobin 12 per surgery. No indication for iv iron at this time. Recommended she receive b12 injections and monitor counts.  ° °Monthly b12 °3 mo- lab (cbc, ferritin, iron studies, b12) °Day to week later (virtual or in person) see Rao, +/- venofer & b12 ° °  °Visit Diagnosis °1. Iron deficiency anemia following bariatric surgery   °2. B12 deficiency   ° °Lauren Allen, DNP, AGNP-C °[facility] °[phone] (clinic) °11/02/2021 ° °

## 2021-11-02 NOTE — Progress Notes (Signed)
Would like to discuss a tummy tuck and her weight gain.

## 2021-11-03 ENCOUNTER — Encounter: Payer: Self-pay | Admitting: Hematology and Oncology

## 2021-11-07 ENCOUNTER — Other Ambulatory Visit: Payer: Self-pay

## 2021-11-07 ENCOUNTER — Inpatient Hospital Stay: Payer: 59

## 2021-11-07 DIAGNOSIS — E538 Deficiency of other specified B group vitamins: Secondary | ICD-10-CM | POA: Diagnosis not present

## 2021-11-07 DIAGNOSIS — D508 Other iron deficiency anemias: Secondary | ICD-10-CM

## 2021-11-07 DIAGNOSIS — K9589 Other complications of other bariatric procedure: Secondary | ICD-10-CM

## 2021-11-07 MED ORDER — CYANOCOBALAMIN 1000 MCG/ML IJ SOLN
1000.0000 ug | Freq: Once | INTRAMUSCULAR | Status: AC
  Administered 2021-11-07: 1000 ug via INTRAMUSCULAR
  Filled 2021-11-07: qty 1

## 2021-11-09 NOTE — Telephone Encounter (Signed)
I can give her clearance. She does not need IV iron. I am not sure who is saying it has to be >12. If it is Gyn- I can discuss with them. Hb of 11.6 is not bad and ferritin is normal. Even if I give her IV iron, there is no guarantee that hb will be >12. I am ok to give her 2 more doses of venofer

## 2021-12-01 ENCOUNTER — Inpatient Hospital Stay: Payer: 59 | Attending: Oncology

## 2021-12-01 ENCOUNTER — Other Ambulatory Visit: Payer: Self-pay

## 2021-12-01 DIAGNOSIS — D519 Vitamin B12 deficiency anemia, unspecified: Secondary | ICD-10-CM | POA: Insufficient documentation

## 2021-12-01 DIAGNOSIS — D508 Other iron deficiency anemias: Secondary | ICD-10-CM

## 2021-12-01 MED ORDER — CYANOCOBALAMIN 1000 MCG/ML IJ SOLN
1000.0000 ug | Freq: Once | INTRAMUSCULAR | Status: AC
  Administered 2021-12-01: 1000 ug via INTRAMUSCULAR
  Filled 2021-12-01: qty 1

## 2021-12-04 IMAGING — MG MM DIGITAL SCREENING BILAT W/ TOMO AND CAD
6 of 10 series · 6 of 30 positions shown · non-contrast
Comparison: None.

CLINICAL DATA: Screening.

EXAM:
DIGITAL SCREENING BILATERAL MAMMOGRAM WITH TOMOSYNTHESIS AND CAD
TECHNIQUE: Bilateral screening digital craniocaudal and mediolateral oblique
mammograms were obtained. Bilateral screening digital breast
tomosynthesis was performed. The images were evaluated with
computer-aided detection.

[R CC synth-2D]
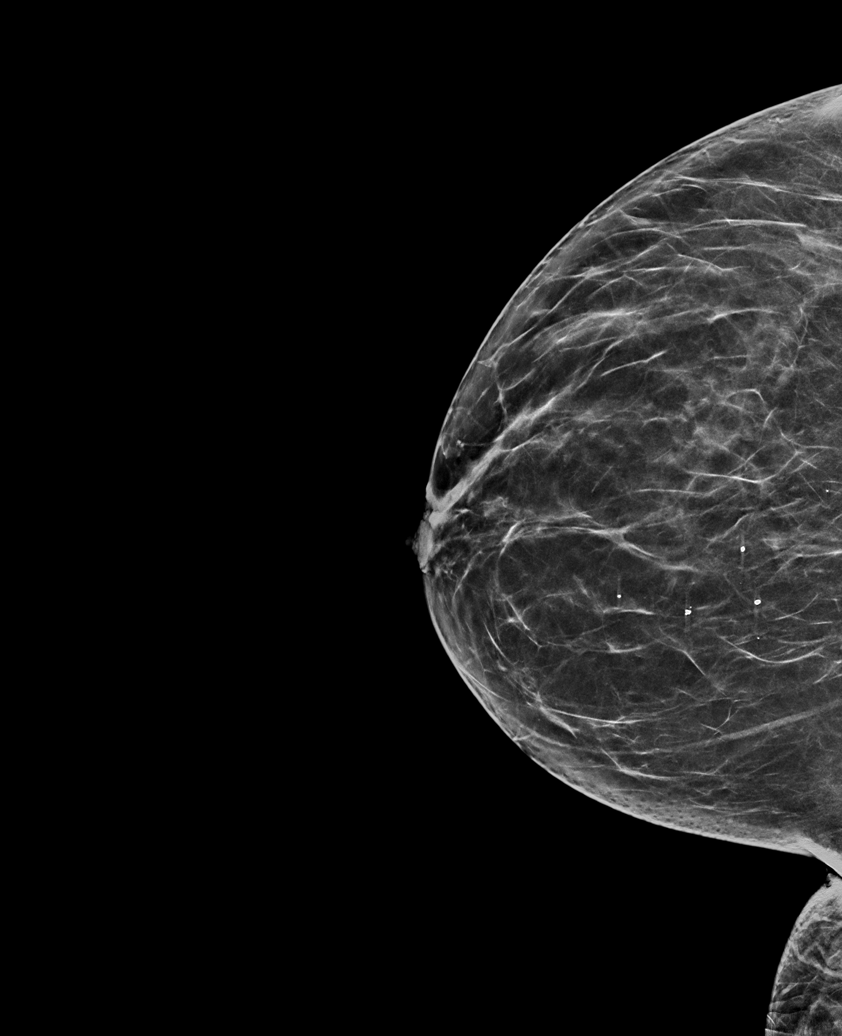

[L MLO synth-2D]
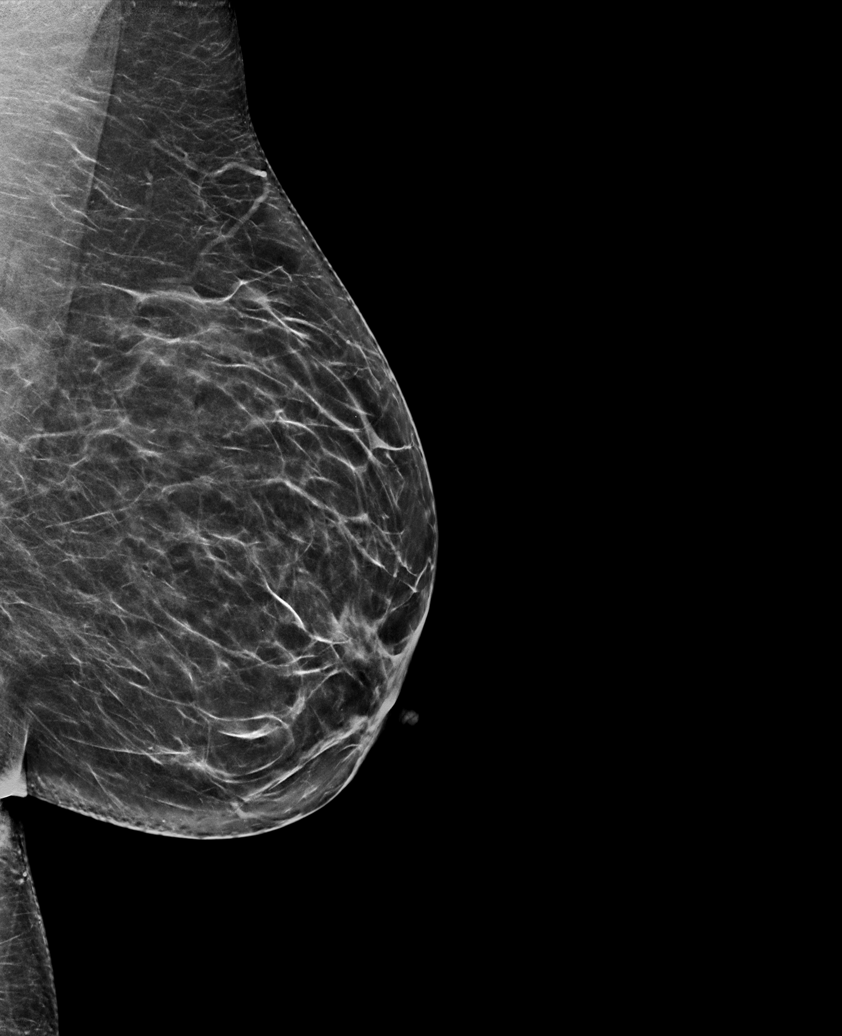

[R MLO synth-2D (1 of 2)]
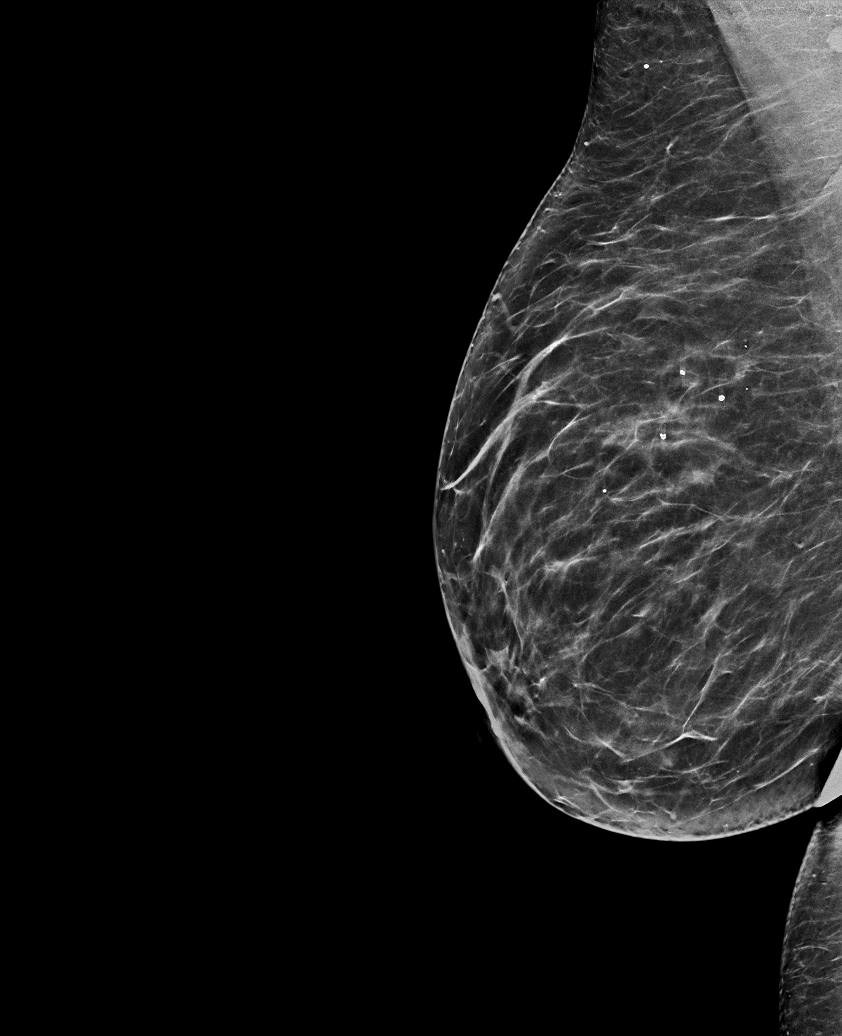

[R MLO synth-2D (2 of 2)]
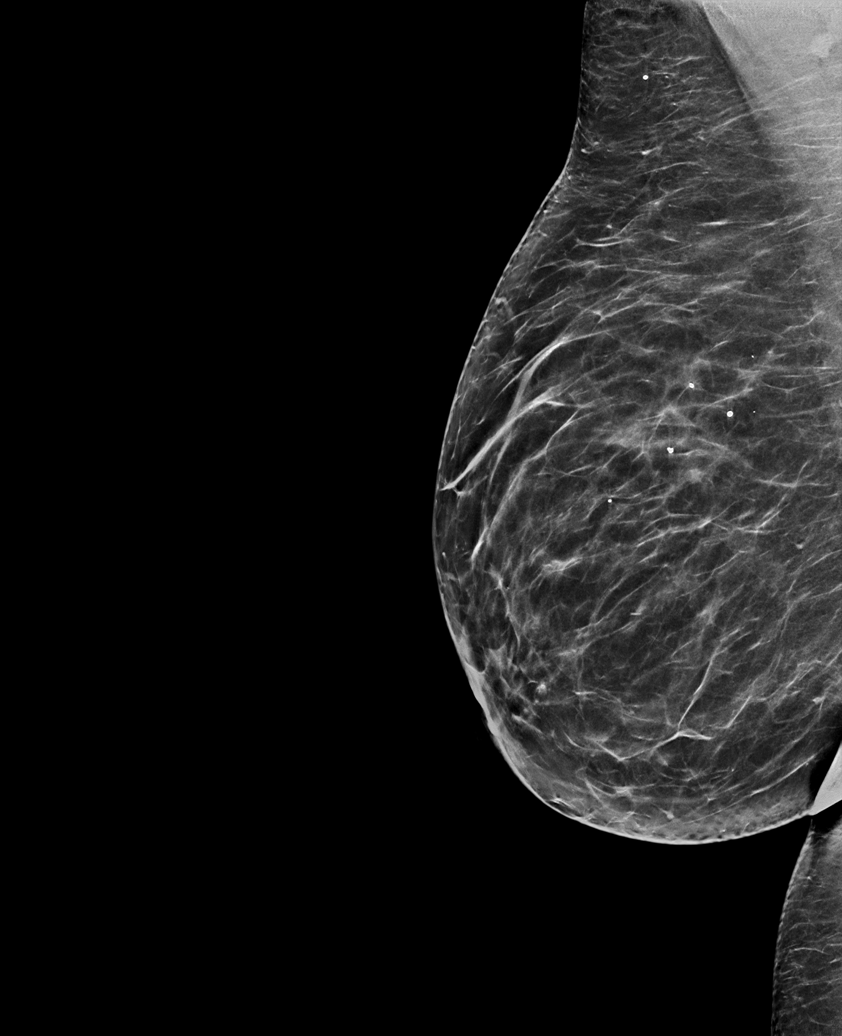

[L CC synth-2D]
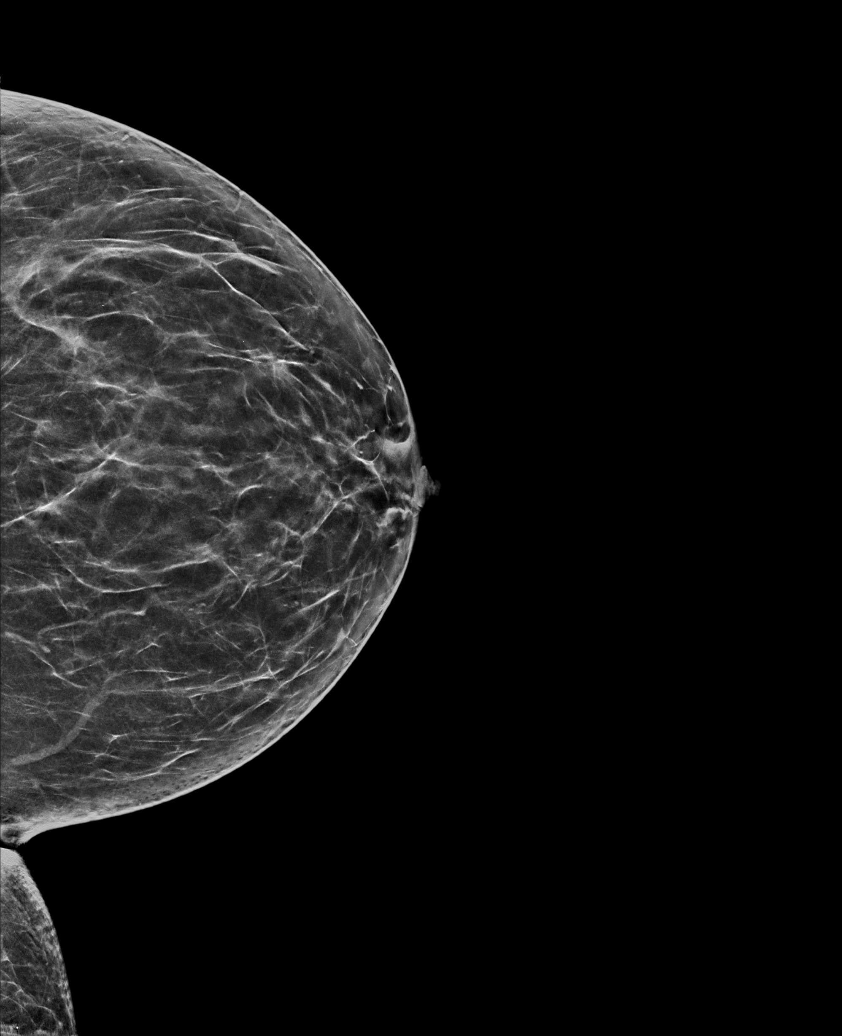

[R MLO tomo · tomo slice 37/72.0]
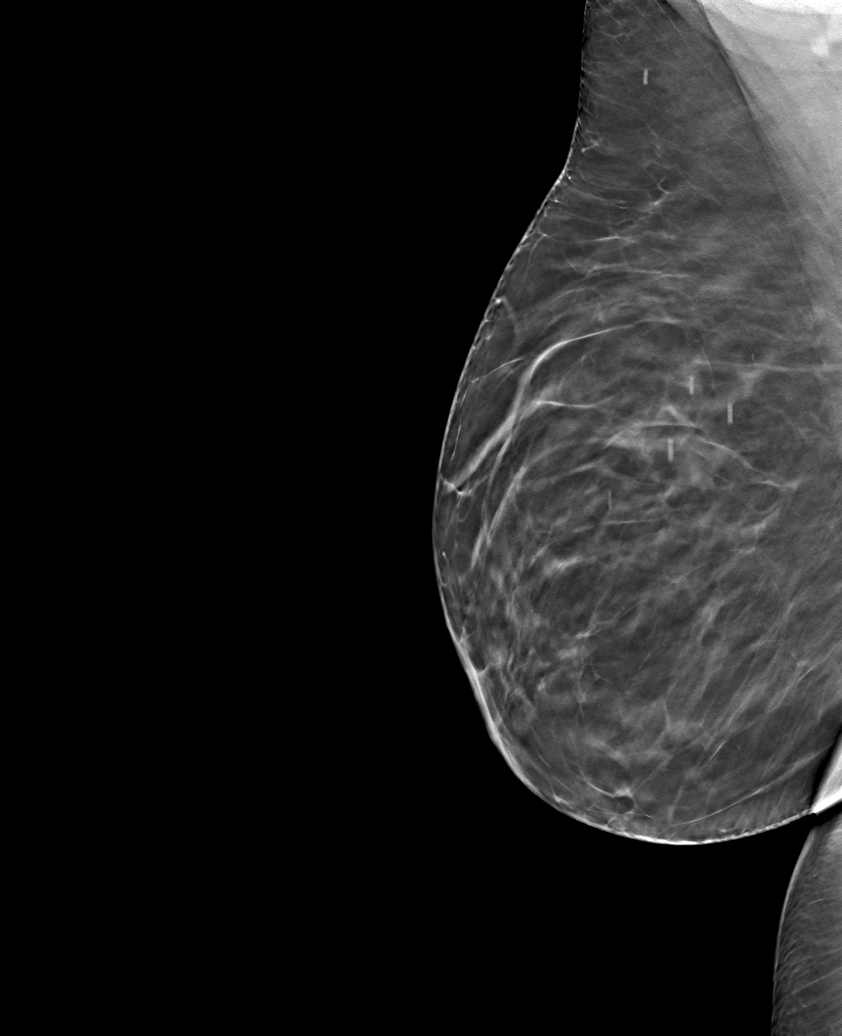

[6 of 30 positions shown; findings below may reference images not displayed]

ACR Breast Density Category b: There are scattered areas of
fibroglandular density.
FINDINGS: There are no findings suspicious for malignancy. The images were
evaluated with computer-aided detection.
IMPRESSION: No mammographic evidence of malignancy. A result letter of this
screening mammogram will be mailed directly to the patient.

RECOMMENDATION:
Screening mammogram in one year. (Code:C7-6-ASJ)

BI-RADS CATEGORY  1: Negative.

## 2021-12-28 ENCOUNTER — Other Ambulatory Visit: Payer: Self-pay

## 2021-12-28 ENCOUNTER — Inpatient Hospital Stay: Payer: 59 | Attending: Oncology

## 2021-12-28 DIAGNOSIS — D508 Other iron deficiency anemias: Secondary | ICD-10-CM

## 2021-12-28 DIAGNOSIS — K9589 Other complications of other bariatric procedure: Secondary | ICD-10-CM

## 2021-12-28 DIAGNOSIS — D513 Other dietary vitamin B12 deficiency anemia: Secondary | ICD-10-CM | POA: Diagnosis not present

## 2021-12-28 MED ORDER — CYANOCOBALAMIN 1000 MCG/ML IJ SOLN
1000.0000 ug | Freq: Once | INTRAMUSCULAR | Status: AC
  Administered 2021-12-28: 1000 ug via INTRAMUSCULAR
  Filled 2021-12-28: qty 1

## 2021-12-29 ENCOUNTER — Ambulatory Visit: Payer: 59

## 2021-12-29 ENCOUNTER — Inpatient Hospital Stay: Payer: 59

## 2022-01-29 ENCOUNTER — Inpatient Hospital Stay: Payer: 59 | Attending: Oncology

## 2022-01-29 DIAGNOSIS — D509 Iron deficiency anemia, unspecified: Secondary | ICD-10-CM | POA: Insufficient documentation

## 2022-01-29 DIAGNOSIS — E538 Deficiency of other specified B group vitamins: Secondary | ICD-10-CM

## 2022-01-29 DIAGNOSIS — Z9884 Bariatric surgery status: Secondary | ICD-10-CM | POA: Insufficient documentation

## 2022-01-29 DIAGNOSIS — D513 Other dietary vitamin B12 deficiency anemia: Secondary | ICD-10-CM | POA: Diagnosis not present

## 2022-01-29 DIAGNOSIS — Z79899 Other long term (current) drug therapy: Secondary | ICD-10-CM | POA: Diagnosis not present

## 2022-01-29 DIAGNOSIS — K9589 Other complications of other bariatric procedure: Secondary | ICD-10-CM

## 2022-01-29 LAB — CBC WITH DIFFERENTIAL/PLATELET
Abs Immature Granulocytes: 0 10*3/uL (ref 0.00–0.07)
Basophils Absolute: 0.1 10*3/uL (ref 0.0–0.1)
Basophils Relative: 1 %
Eosinophils Absolute: 0.4 10*3/uL (ref 0.0–0.5)
Eosinophils Relative: 5 %
HCT: 39.8 % (ref 36.0–46.0)
Hemoglobin: 12.5 g/dL (ref 12.0–15.0)
Immature Granulocytes: 0 %
Lymphocytes Relative: 38 %
Lymphs Abs: 3 10*3/uL (ref 0.7–4.0)
MCH: 25.9 pg — ABNORMAL LOW (ref 26.0–34.0)
MCHC: 31.4 g/dL (ref 30.0–36.0)
MCV: 82.6 fL (ref 80.0–100.0)
Monocytes Absolute: 0.7 10*3/uL (ref 0.1–1.0)
Monocytes Relative: 8 %
Neutro Abs: 3.7 10*3/uL (ref 1.7–7.7)
Neutrophils Relative %: 48 %
Platelets: 468 10*3/uL — ABNORMAL HIGH (ref 150–400)
RBC: 4.82 MIL/uL (ref 3.87–5.11)
RDW: 13.2 % (ref 11.5–15.5)
WBC: 7.9 10*3/uL (ref 4.0–10.5)
nRBC: 0 % (ref 0.0–0.2)

## 2022-01-29 LAB — IRON AND TIBC
Iron: 46 ug/dL (ref 28–170)
Saturation Ratios: 14 % (ref 10.4–31.8)
TIBC: 335 ug/dL (ref 250–450)
UIBC: 289 ug/dL

## 2022-01-29 LAB — VITAMIN B12: Vitamin B-12: 482 pg/mL (ref 180–914)

## 2022-01-29 LAB — FERRITIN: Ferritin: 29 ng/mL (ref 11–307)

## 2022-01-31 ENCOUNTER — Inpatient Hospital Stay: Payer: 59

## 2022-01-31 ENCOUNTER — Inpatient Hospital Stay (HOSPITAL_BASED_OUTPATIENT_CLINIC_OR_DEPARTMENT_OTHER): Payer: 59 | Admitting: Oncology

## 2022-01-31 ENCOUNTER — Encounter: Payer: Self-pay | Admitting: Oncology

## 2022-01-31 VITALS — BP 120/77 | HR 73 | Temp 97.2°F | Resp 16 | Wt 167.9 lb

## 2022-01-31 DIAGNOSIS — D508 Other iron deficiency anemias: Secondary | ICD-10-CM | POA: Diagnosis not present

## 2022-01-31 DIAGNOSIS — K9589 Other complications of other bariatric procedure: Secondary | ICD-10-CM

## 2022-01-31 DIAGNOSIS — D509 Iron deficiency anemia, unspecified: Secondary | ICD-10-CM | POA: Diagnosis not present

## 2022-01-31 DIAGNOSIS — Z862 Personal history of diseases of the blood and blood-forming organs and certain disorders involving the immune mechanism: Secondary | ICD-10-CM

## 2022-01-31 DIAGNOSIS — E538 Deficiency of other specified B group vitamins: Secondary | ICD-10-CM | POA: Diagnosis not present

## 2022-01-31 MED ORDER — CYANOCOBALAMIN 1000 MCG/ML IJ SOLN
1000.0000 ug | Freq: Once | INTRAMUSCULAR | Status: AC
  Administered 2022-01-31: 1000 ug via INTRAMUSCULAR
  Filled 2022-01-31: qty 1

## 2022-01-31 MED ORDER — SODIUM CHLORIDE 0.9 % IV SOLN
INTRAVENOUS | Status: DC
  Filled 2022-01-31: qty 250

## 2022-01-31 MED ORDER — IRON SUCROSE 20 MG/ML IV SOLN
200.0000 mg | INTRAVENOUS | Status: DC
  Administered 2022-01-31: 200 mg via INTRAVENOUS
  Filled 2022-01-31: qty 10

## 2022-01-31 NOTE — Patient Instructions (Addendum)
[facility] [place]  Discharge Instructions: ?Thank you for choosing [facility] to provide your oncology and hematology care.  ?If you have a lab appointment with the [facility], please go directly to the [facility] and check in at the registration area. ? ?Wear comfortable clothing and clothing appropriate for easy access to any Portacath or PICC line.  ? ?We strive to give you quality time with your provider. You may need to reschedule your appointment if you arrive late (15 or more minutes).  Arriving late affects you and other patients whose appointments are after yours.  Also, if you miss three or more appointments without notifying the office, you may be dismissed from the clinic at the provider?s discretion.    ?  ?For prescription refill requests, have your pharmacy contact our office and allow 72 hours for refills to be completed.   ? ?Today you received the following chemotherapy and/or immunotherapy agents VENOFER   & VITAMIN B12 ?  ?To help prevent nausea and vomiting after your treatment, we encourage you to take your nausea medication as directed. ? ?BELOW ARE SYMPTOMS THAT SHOULD BE REPORTED IMMEDIATELY: ?*FEVER GREATER THAN 100.4 F (38 ?C) OR HIGHER ?*CHILLS OR SWEATING ?*NAUSEA AND VOMITING THAT IS NOT CONTROLLED WITH YOUR NAUSEA MEDICATION ?*UNUSUAL SHORTNESS OF BREATH ?*UNUSUAL BRUISING OR BLEEDING ?*URINARY PROBLEMS (pain or burning when urinating, or frequent urination) ?*BOWEL PROBLEMS (unusual diarrhea, constipation, pain near the anus) ?TENDERNESS IN MOUTH AND THROAT WITH OR WITHOUT PRESENCE OF ULCERS (sore throat, sores in mouth, or a toothache) ?UNUSUAL RASH, SWELLING OR PAIN  ?UNUSUAL VAGINAL DISCHARGE OR ITCHING  ? ?Items with * indicate a potential emergency and should be followed up as soon as possible or go to the Emergency Department if any problems should occur. ? ?Please show the CHEMOTHERAPY ALERT CARD or IMMUNOTHERAPY ALERT CARD at  check-in to the Emergency Department and triage nurse. ? ?Should you have questions after your visit or need to cancel or reschedule your appointment, please contact [facility] [place] [facility] [place]  [phone] and follow the prompts.  Office hours are 8:00 a.m. to 4:30 p.m. Monday - Friday. Please note that voicemails left after 4:00 p.m. may not be returned until the following business day.  We are closed weekends and major holidays. You have access to a nurse at all times for urgent questions. Please call the main number to the clinic [phone] and follow the prompts. ? ?For any non-urgent questions, you may also contact your provider using MyChart. We now offer e-Visits for anyone 36 and older to request care online for non-urgent symptoms. For details visit [email or web address]. ?  ?Also download the MyChart app! Go to the app store, search "MyChart", open the app, select [facility], and log in with your MyChart username and password. ? ?Due to Covid, a mask is required upon entering the hospital/clinic. If you do not have a mask, one will be given to you upon arrival. For doctor visits, patients may have 1 support person aged 6 or older with them. For treatment visits, patients cannot have anyone with them due to current Covid guidelines and our immunocompromised population.  ? ?Iron Sucrose Injection ?What is this medication? ?IRON SUCROSE (EYE ern SOO krose) treats low levels of iron (iron deficiency anemia) in people with kidney disease. Iron is a mineral that plays an important role in making red blood cells, which carry oxygen from your lungs to the rest of your body. ?This  medicine may be used for other purposes; ask your health care provider or pharmacist if you have questions. ?COMMON BRAND NAME(S): Venofer ?What should I tell my care team before I take this medication? ?They need to know if you have any of these conditions: ?Anemia not caused by low iron levels ?Heart  disease ?High levels of iron in the blood ?Kidney disease ?Liver disease ?An unusual or allergic reaction to iron, other medications, foods, dyes, or preservatives ?Pregnant or trying to get pregnant ?Breast-feeding ?How should I use this medication? ?This medication is for infusion into a vein. It is given in a hospital or clinic setting. ?Talk to your care team about the use of this medication in children. While this medication may be prescribed for children as young as 2 years for selected conditions, precautions do apply. ?Overdosage: If you think you have taken too much of this medicine contact a poison control center or emergency room at once. ?NOTE: This medicine is only for you. Do not share this medicine with others. ?What if I miss a dose? ?It is important not to miss your dose. Call your care team if you are unable to keep an appointment. ?What may interact with this medication? ?Do not take this medication with any of the following: ?Deferoxamine ?Dimercaprol ?Other iron products ?This medication may also interact with the following: ?Chloramphenicol ?Deferasirox ?This list may not describe all possible interactions. Give your health care provider a list of all the medicines, herbs, non-prescription drugs, or dietary supplements you use. Also tell them if you smoke, drink alcohol, or use illegal drugs. Some items may interact with your medicine. ?What should I watch for while using this medication? ?Visit your care team regularly. Tell your care team if your symptoms do not start to get better or if they get worse. You may need blood work done while you are taking this medication. ?You may need to follow a special diet. Talk to your care team. Foods that contain iron include: whole grains/cereals, dried fruits, beans, or peas, leafy green vegetables, and organ meats (liver, kidney). ?What side effects may I notice from receiving this medication? ?Side effects that you should report to your care team as  soon as possible: ?Allergic reactions--skin rash, itching, hives, swelling of the face, lips, tongue, or throat ?Low blood pressure--dizziness, feeling faint or lightheaded, blurry vision ?Shortness of breath ?Side effects that usually do not require medical attention (report to your care team if they continue or are bothersome): ?Flushing ?Headache ?Joint pain ?Muscle pain ?Nausea ?Pain, redness, or irritation at injection site ?This list may not describe all possible side effects. Call your doctor for medical advice about side effects. You may report side effects to FDA at 1-800-FDA-1088. ?Where should I keep my medication? ?This medication is given in a hospital or clinic and will not be stored at home. ?NOTE: This sheet is a summary. It may not cover all possible information. If you have questions about this medicine, talk to your doctor, pharmacist, or health care provider. ?? 2023 Elsevier/Gold Standard (2021-02-17 00:00:00) ? ?Vitamin B12 Injection ?What is this medication? ?Vitamin B12 (VAHY tuh min B12) prevents and treats low vitamin B12 levels in your body. It is used in people who do not get enough vitamin B12 from their diet or when their digestive tract does not absorb enough. Vitamin B12 plays an important role in maintaining the health of your nervous system and red blood cells. ?This medicine may be used for other purposes; ask  your health care provider or pharmacist if you have questions. ?COMMON BRAND NAME(S): B-12 Compliance Kit, B-12 Injection Kit, Cyomin, Dodex, LA-12, Nutri-Twelve, Physicians EZ Use B-12, Primabalt ?What should I tell my care team before I take this medication? ?They need to know if you have any of these conditions: ?Kidney disease ?Leber's disease ?Megaloblastic anemia ?An unusual or allergic reaction to cyanocobalamin, cobalt, other medications, foods, dyes, or preservatives ?Pregnant or trying to get pregnant ?Breast-feeding ?How should I use this medication? ?This  medication is injected into a muscle or deeply under the skin. It is usually given in a clinic or care team's office. However, your care team may teach you how to inject yourself. Follow all instructions. ?Talk to your car

## 2022-02-01 ENCOUNTER — Encounter: Payer: Self-pay | Admitting: Hematology and Oncology

## 2022-02-01 NOTE — Progress Notes (Signed)
? ? ? ?Hematology/Oncology Consult note ?[facility]  ?[phone]) [phone] [phone] ? ?Patient Care Team: ?Jose-Mathews, Nada Boozer, MD as PCP - General (Family Medicine)  ? ?Name of the patient: Leah Solis  ?[id number]  ?1980-12-10  ? ?Date of visit: 02/01/22 ? ?Diagnosis-iron deficiency anemia ? ?Chief complaint/ Reason for visit-routine follow-up of iron and B12 deficiency anemia ? 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  She is unable to tolerate oral iron and has received IV iron in the past.  She has been on monthly B12 injections. ?  ? ?Interval history-patient is doing well overall and denies any specific complaints at this time other than mild fatigue ? ?ECOG PS- 0 ?Pain scale- 0 ? ? ?Review of systems- Review of Systems  ?Constitutional:  Positive for malaise/fatigue. Negative for chills, fever and weight loss.  ?HENT:  Negative for congestion, ear discharge and nosebleeds.   ?Eyes:  Negative for blurred vision.  ?Respiratory:  Negative for cough, hemoptysis, sputum production, shortness of breath and wheezing.   ?Cardiovascular:  Negative for chest pain, palpitations, orthopnea and claudication.  ?Gastrointestinal:  Negative for abdominal pain, blood in stool, constipation, diarrhea, heartburn, melena, nausea and vomiting.  ?Genitourinary:  Negative for dysuria, flank pain, frequency, hematuria and urgency.  ?Musculoskeletal:  Negative for back pain, joint pain and myalgias.  ?Skin:  Negative for rash.  ?Neurological:  Negative for dizziness, tingling, focal weakness, seizures, weakness and headaches.  ?Endo/Heme/Allergies:  Does not bruise/bleed easily.  ?Psychiatric/Behavioral:  Negative for depression and suicidal ideas. The patient does not have insomnia.    ? ? ? ?No Known Allergies ? ? ?Past Medical History:  ?Diagnosis Date  ? Anemia   ?  Calcium deficiency   ? ? ? ?Past Surgical History:  ?Procedure Laterality Date  ? BARIATRIC SURGERY    ? CESAREAN SECTION    ? ? ?Social History  ? ?Socioeconomic History  ? Marital status: Divorced  ?  Spouse name: Not on file  ? Number of children: Not on file  ? Years of education: Not on file  ? Highest education level: Not on file  ?Occupational History  ? Not on file  ?Tobacco Use  ? Smoking status: Never  ? Smokeless tobacco: Never  ?Vaping Use  ? Vaping Use: Never used  ?Substance and Sexual Activity  ? Alcohol use: Yes  ?  Comment: occassionally  ? Drug use: Never  ? Sexual activity: Yes  ?Other Topics Concern  ? Not on file  ?Social History Narrative  ? Not on file  ? ?Social Determinants of Health  ? ?Financial Resource Strain: Not on file  ?Food Insecurity: Not on file  ?Transportation Needs: Not on file  ?Physical Activity: Not on file  ?Stress: Not on file  ?Social Connections: Not on file  ?Intimate Partner Violence: Not on file  ? ? ?Family History  ?Problem Relation Age of Onset  ? Diabetes Mother   ? Thyroid disease Mother   ? Breast cancer Neg Hx   ? ? ? ?Current Outpatient Medications:  ?  cyanocobalamin (,VITAMIN B-12,) 1000 MCG/ML injection, Inject into the muscle., Disp: , Rfl:  ?  cetirizine (ZYRTEC) 10 MG tablet, Take 1 tablet by mouth daily., Disp: , Rfl:  ?  COVID-19 Specimen Collection KIT, See admin instructions. for testing (Patient not taking: Reported on 04/19/2021), Disp: , Rfl:  ?  cyclobenzaprine (FLEXERIL) 10  MG tablet, Take 10 mg by mouth every 8 (eight) hours as needed. (Patient not taking: Reported on 01/31/2022), Disp: , Rfl:  ?  docusate sodium (COLACE) 100 MG capsule, Take 1 capsule (100 mg total) by mouth daily. (Patient not taking: Reported on 04/12/2020), Disp: 30 capsule, Rfl: 2 ?  ferrous gluconate (FERGON) 324 MG tablet, Take 1 tablet (324 mg total) by mouth 2 (two) times daily with a meal. (Patient not taking: Reported on 01/04/2020), Disp: 60 tablet, Rfl: 2 ?  fluconazole  (DIFLUCAN) 150 MG tablet, Take 150 mg by mouth once. (Patient not taking: Reported on 04/19/2021), Disp: , Rfl:  ?  gabapentin (NEURONTIN) 300 MG capsule, Take 300 mg by mouth daily. (Patient not taking: Reported on 01/31/2022), Disp: , Rfl:  ?  nitrofurantoin, macrocrystal-monohydrate, (MACROBID) 100 MG capsule, Take 100 mg by mouth 2 (two) times daily. (Patient not taking: Reported on 04/19/2021), Disp: , Rfl:  ? ?Physical exam:  ?Vitals:  ? 01/31/22 1304  ?BP: 120/77  ?Pulse: 73  ?Resp: 16  ?Temp: (!) 97.2 ?F (36.2 ?C)  ?SpO2: 100%  ?Weight: 167 lb 14.4 oz (76.2 kg)  ? ?Physical Exam ?Constitutional:   ?   General: She is not in acute distress. ?Cardiovascular:  ?   Rate and Rhythm: Normal rate and regular rhythm.  ?   Heart sounds: Normal heart sounds.  ?Pulmonary:  ?   Effort: Pulmonary effort is normal.  ?   Breath sounds: Normal breath sounds.  ?Skin: ?   General: Skin is warm and dry.  ?Neurological:  ?   Mental Status: She is alert and oriented to person, place, and time.  ?  ? ? ?  Latest Ref Rng & Units 01/09/2017  ?  3:23 PM  ?CMP  ?Glucose 65 - 99 mg/dL 75    ?BUN 6 - 20 mg/dL 9    ?Creatinine 0.57 - 1.00 mg/dL 0.62    ?Sodium 134 - 144 mmol/L 141    ?Potassium 3.5 - 5.2 mmol/L 3.9    ?Chloride 96 - 106 mmol/L 101    ?CO2 18 - 29 mmol/L 26    ?Calcium 8.7 - 10.2 mg/dL 8.7    ? ? ?  Latest Ref Rng & Units 01/29/2022  ?  2:28 PM  ?CBC  ?WBC 4.0 - 10.5 K/uL 7.9    ?Hemoglobin 12.0 - 15.0 g/dL 12.5    ?Hematocrit 36.0 - 46.0 % 39.8    ?Platelets 150 - 400 K/uL 468    ? ? ? ?Assessment and plan- Patient is a 41 y.o. female with iron deficiency anemia here for routine follow-up ? ?Patient is not presently anemicWith an H&H of 12.5/29.8.  Ferritin levels are however low at 29 with an iron saturation of 14%.  Patient does report ongoing fatigue as well.  We will therefore schedule her for 3 doses of Venofer at this time. ? ?We will check CBC ferritin and iron studies in 3 in 6 months and I will see her back in 6  months.  B12 levels to be also checked in 3 months ?Visit Diagnosis ?1. Iron deficiency anemia following bariatric surgery   ?2. B12 deficiency   ? ? ? ?Dr. Randa Evens, MD, MPH ?[facility] ?[phone] ?02/01/2022 ?10:10 AM ? ? ? ? ? ? ?    ? ? ? ? ? ?

## 2022-02-07 ENCOUNTER — Inpatient Hospital Stay: Payer: 59 | Attending: Oncology

## 2022-02-07 VITALS — BP 122/71 | HR 80 | Temp 97.4°F | Resp 18

## 2022-02-07 DIAGNOSIS — Z9884 Bariatric surgery status: Secondary | ICD-10-CM | POA: Diagnosis not present

## 2022-02-07 DIAGNOSIS — E538 Deficiency of other specified B group vitamins: Secondary | ICD-10-CM | POA: Diagnosis not present

## 2022-02-07 DIAGNOSIS — D509 Iron deficiency anemia, unspecified: Secondary | ICD-10-CM | POA: Diagnosis present

## 2022-02-07 DIAGNOSIS — K9589 Other complications of other bariatric procedure: Secondary | ICD-10-CM

## 2022-02-07 MED ORDER — SODIUM CHLORIDE 0.9 % IV SOLN
INTRAVENOUS | Status: DC
  Filled 2022-02-07 (×2): qty 250

## 2022-02-07 MED ORDER — IRON SUCROSE 20 MG/ML IV SOLN
200.0000 mg | INTRAVENOUS | Status: DC
  Administered 2022-02-07: 200 mg via INTRAVENOUS
  Filled 2022-02-07: qty 10

## 2022-02-14 ENCOUNTER — Inpatient Hospital Stay: Payer: 59

## 2022-02-14 VITALS — BP 129/84 | HR 84 | Temp 97.2°F | Resp 18

## 2022-02-14 DIAGNOSIS — Z862 Personal history of diseases of the blood and blood-forming organs and certain disorders involving the immune mechanism: Secondary | ICD-10-CM

## 2022-02-14 DIAGNOSIS — D509 Iron deficiency anemia, unspecified: Secondary | ICD-10-CM | POA: Diagnosis not present

## 2022-02-14 DIAGNOSIS — K9589 Other complications of other bariatric procedure: Secondary | ICD-10-CM

## 2022-02-14 MED ORDER — IRON SUCROSE 20 MG/ML IV SOLN
200.0000 mg | INTRAVENOUS | Status: DC
  Administered 2022-02-14: 200 mg via INTRAVENOUS

## 2022-02-14 MED ORDER — SODIUM CHLORIDE 0.9 % IV SOLN
INTRAVENOUS | Status: DC
  Filled 2022-02-14: qty 250

## 2022-02-14 NOTE — Patient Instructions (Signed)
[facility]  Discharge Instructions: ?Thank you for choosing [facility] to provide your oncology and hematology care.  ?If you have a lab appointment with the Cancer Center, please go directly to the Cancer Center and check in at the registration area. ? ?Wear comfortable clothing and clothing appropriate for easy access to any Portacath or PICC line.  ? ?We strive to give you quality time with your provider. You may need to reschedule your appointment if you arrive late (15 or more minutes).  Arriving late affects you and other patients whose appointments are after yours.  Also, if you miss three or more appointments without notifying the office, you may be dismissed from the clinic at the provider?s discretion.    ?  ?For prescription refill requests, have your pharmacy contact our office and allow 72 hours for refills to be completed.   ? ?Today you received the following chemotherapy and/or immunotherapy agents VENOFER ?    ?  ?To help prevent nausea and vomiting after your treatment, we encourage you to take your nausea medication as directed. ? ?BELOW ARE SYMPTOMS THAT SHOULD BE REPORTED IMMEDIATELY: ?*FEVER GREATER THAN 100.4 F (38 ?C) OR HIGHER ?*CHILLS OR SWEATING ?*NAUSEA AND VOMITING THAT IS NOT CONTROLLED WITH YOUR NAUSEA MEDICATION ?*UNUSUAL SHORTNESS OF BREATH ?*UNUSUAL BRUISING OR BLEEDING ?*URINARY PROBLEMS (pain or burning when urinating, or frequent urination) ?*BOWEL PROBLEMS (unusual diarrhea, constipation, pain near the anus) ?TENDERNESS IN MOUTH AND THROAT WITH OR WITHOUT PRESENCE OF ULCERS (sore throat, sores in mouth, or a toothache) ?UNUSUAL RASH, SWELLING OR PAIN  ?UNUSUAL VAGINAL DISCHARGE OR ITCHING  ? ?Items with * indicate a potential emergency and should be followed up as soon as possible or go to the Emergency Department if any problems should occur. ? ?Please show the CHEMOTHERAPY ALERT CARD or IMMUNOTHERAPY ALERT CARD at check-in to  the Emergency Department and triage nurse. ? ?Should you have questions after your visit or need to cancel or reschedule your appointment, please contact [facility]  [phone] and follow the prompts.  Office hours are 8:00 a.m. to 4:30 p.m. Monday - Friday. Please note that voicemails left after 4:00 p.m. may not be returned until the following business day.  We are closed weekends and major holidays. You have access to a nurse at all times for urgent questions. Please call the main number to the clinic [phone] and follow the prompts. ? ?For any non-urgent questions, you may also contact your provider using MyChart. We now offer e-Visits for anyone 18 and older to request care online for non-urgent symptoms. For details visit [email or web address]. ?  ?Also download the MyChart app! Go to the app store, search "MyChart", open the app, select [facility], and log in with your MyChart username and password. ? ?Due to Covid, a mask is required upon entering the hospital/clinic. If you do not have a mask, one will be given to you upon arrival. For doctor visits, patients may have 1 support person aged 18 or older with them. For treatment visits, patients cannot have anyone with them due to current Covid guidelines and our immunocompromised population.  ? ?Iron Sucrose Injection ?What is this medication? ?IRON SUCROSE (EYE ern SOO krose) treats low levels of iron (iron deficiency anemia) in people with kidney disease. Iron is a mineral that plays an important role in making red blood cells, which carry oxygen from your lungs to the rest of your body. ?This medicine   may be used for other purposes; ask your health care provider or pharmacist if you have questions. ?COMMON BRAND NAME(S): Venofer ?What should I tell my care team before I take this medication? ?They need to know if you have any of these conditions: ?Anemia not caused by low iron levels ?Heart disease ?High levels of  iron in the blood ?Kidney disease ?Liver disease ?An unusual or allergic reaction to iron, other medications, foods, dyes, or preservatives ?Pregnant or trying to get pregnant ?Breast-feeding ?How should I use this medication? ?This medication is for infusion into a vein. It is given in a hospital or clinic setting. ?Talk to your care team about the use of this medication in children. While this medication may be prescribed for children as young as 2 years for selected conditions, precautions do apply. ?Overdosage: If you think you have taken too much of this medicine contact a poison control center or emergency room at once. ?NOTE: This medicine is only for you. Do not share this medicine with others. ?What if I miss a dose? ?It is important not to miss your dose. Call your care team if you are unable to keep an appointment. ?What may interact with this medication? ?Do not take this medication with any of the following: ?Deferoxamine ?Dimercaprol ?Other iron products ?This medication may also interact with the following: ?Chloramphenicol ?Deferasirox ?This list may not describe all possible interactions. Give your health care provider a list of all the medicines, herbs, non-prescription drugs, or dietary supplements you use. Also tell them if you smoke, drink alcohol, or use illegal drugs. Some items may interact with your medicine. ?What should I watch for while using this medication? ?Visit your care team regularly. Tell your care team if your symptoms do not start to get better or if they get worse. You may need blood work done while you are taking this medication. ?You may need to follow a special diet. Talk to your care team. Foods that contain iron include: whole grains/cereals, dried fruits, beans, or peas, leafy green vegetables, and organ meats (liver, kidney). ?What side effects may I notice from receiving this medication? ?Side effects that you should report to your care team as soon as  possible: ?Allergic reactions--skin rash, itching, hives, swelling of the face, lips, tongue, or throat ?Low blood pressure--dizziness, feeling faint or lightheaded, blurry vision ?Shortness of breath ?Side effects that usually do not require medical attention (report to your care team if they continue or are bothersome): ?Flushing ?Headache ?Joint pain ?Muscle pain ?Nausea ?Pain, redness, or irritation at injection site ?This list may not describe all possible side effects. Call your doctor for medical advice about side effects. You may report side effects to FDA at 1-800-FDA-1088. ?Where should I keep my medication? ?This medication is given in a hospital or clinic and will not be stored at home. ?NOTE: This sheet is a summary. It may not cover all possible information. If you have questions about this medicine, talk to your doctor, pharmacist, or health care provider. ?? 2023 Elsevier/Gold Standard (2021-02-17 00:00:00) ? ?

## 2022-05-02 ENCOUNTER — Inpatient Hospital Stay: Payer: 59 | Attending: Oncology

## 2022-05-02 ENCOUNTER — Inpatient Hospital Stay: Payer: 59

## 2022-05-02 DIAGNOSIS — D509 Iron deficiency anemia, unspecified: Secondary | ICD-10-CM | POA: Insufficient documentation

## 2022-05-02 DIAGNOSIS — D508 Other iron deficiency anemias: Secondary | ICD-10-CM

## 2022-05-02 DIAGNOSIS — E538 Deficiency of other specified B group vitamins: Secondary | ICD-10-CM

## 2022-05-02 LAB — CBC
HCT: 39.4 % (ref 36.0–46.0)
Hemoglobin: 12.6 g/dL (ref 12.0–15.0)
MCH: 26.6 pg (ref 26.0–34.0)
MCHC: 32 g/dL (ref 30.0–36.0)
MCV: 83.1 fL (ref 80.0–100.0)
Platelets: 450 10*3/uL — ABNORMAL HIGH (ref 150–400)
RBC: 4.74 MIL/uL (ref 3.87–5.11)
RDW: 13.6 % (ref 11.5–15.5)
WBC: 11 10*3/uL — ABNORMAL HIGH (ref 4.0–10.5)
nRBC: 0 % (ref 0.0–0.2)

## 2022-05-02 LAB — IRON AND TIBC
Iron: 56 ug/dL (ref 28–170)
Saturation Ratios: 17 % (ref 10.4–31.8)
TIBC: 328 ug/dL (ref 250–450)
UIBC: 272 ug/dL

## 2022-05-02 LAB — FERRITIN: Ferritin: 92 ng/mL (ref 11–307)

## 2022-08-03 ENCOUNTER — Ambulatory Visit: Payer: 59 | Admitting: Oncology

## 2022-08-03 ENCOUNTER — Other Ambulatory Visit: Payer: 59

## 2022-08-14 ENCOUNTER — Encounter: Payer: Self-pay | Admitting: Oncology

## 2022-08-14 ENCOUNTER — Inpatient Hospital Stay: Payer: 59 | Attending: Oncology

## 2022-08-14 ENCOUNTER — Inpatient Hospital Stay (HOSPITAL_BASED_OUTPATIENT_CLINIC_OR_DEPARTMENT_OTHER): Payer: 59 | Admitting: Oncology

## 2022-08-14 ENCOUNTER — Other Ambulatory Visit: Payer: Self-pay | Admitting: *Deleted

## 2022-08-14 VITALS — BP 102/68 | HR 76 | Temp 99.4°F | Resp 16 | Wt 180.2 lb

## 2022-08-14 DIAGNOSIS — D508 Other iron deficiency anemias: Secondary | ICD-10-CM

## 2022-08-14 DIAGNOSIS — E538 Deficiency of other specified B group vitamins: Secondary | ICD-10-CM | POA: Diagnosis not present

## 2022-08-14 DIAGNOSIS — D509 Iron deficiency anemia, unspecified: Secondary | ICD-10-CM | POA: Diagnosis not present

## 2022-08-14 DIAGNOSIS — Z9884 Bariatric surgery status: Secondary | ICD-10-CM | POA: Insufficient documentation

## 2022-08-14 DIAGNOSIS — Z862 Personal history of diseases of the blood and blood-forming organs and certain disorders involving the immune mechanism: Secondary | ICD-10-CM

## 2022-08-14 LAB — IRON AND TIBC
Iron: 63 ug/dL (ref 28–170)
Saturation Ratios: 19 % (ref 10.4–31.8)
TIBC: 339 ug/dL (ref 250–450)
UIBC: 276 ug/dL

## 2022-08-14 LAB — CBC
HCT: 39.7 % (ref 36.0–46.0)
Hemoglobin: 12.4 g/dL (ref 12.0–15.0)
MCH: 26.2 pg (ref 26.0–34.0)
MCHC: 31.2 g/dL (ref 30.0–36.0)
MCV: 83.8 fL (ref 80.0–100.0)
Platelets: 461 10*3/uL — ABNORMAL HIGH (ref 150–400)
RBC: 4.74 MIL/uL (ref 3.87–5.11)
RDW: 13.2 % (ref 11.5–15.5)
WBC: 8.5 10*3/uL (ref 4.0–10.5)
nRBC: 0 % (ref 0.0–0.2)

## 2022-08-14 LAB — VITAMIN B12: Vitamin B-12: 260 pg/mL (ref 180–914)

## 2022-08-14 LAB — FERRITIN: Ferritin: 46 ng/mL (ref 11–307)

## 2022-08-14 NOTE — Progress Notes (Signed)
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  She is unable to tolerate oral iron and has received IV iron in the past.  She has been on monthly B12 injections.   Interval history-patient reports some ongoing fatigue.  Menstrual cycles can be heavy at times.  Denies any blood loss in her stool or urine.  ECOG PS- 0 Pain scale- 0   Review of systems- Review of Systems  Constitutional:  Positive for malaise/fatigue. Negative for chills, fever and weight loss.  HENT:  Negative for congestion, ear discharge and nosebleeds.   Eyes:  Negative for blurred vision.  Respiratory:  Negative for cough, hemoptysis, sputum production, shortness of breath and wheezing.   Cardiovascular:  Negative for chest pain, palpitations, orthopnea and claudication.  Gastrointestinal:  Negative for abdominal pain, blood in stool, constipation, diarrhea, heartburn, melena, nausea and vomiting.  Genitourinary:  Negative for dysuria, flank pain, frequency, hematuria and urgency.  Musculoskeletal:  Negative for back pain, joint pain and myalgias.  Skin:  Negative for rash.  Neurological:  Negative for dizziness, tingling, focal weakness, seizures, weakness and headaches.  Endo/Heme/Allergies:  Does not bruise/bleed easily.  Psychiatric/Behavioral:  Negative for depression and suicidal ideas. The patient does not have insomnia.       No Known Allergies   Past Medical History:  Diagnosis Date    Anemia    Calcium deficiency      Past Surgical History:  Procedure Laterality Date   BARIATRIC SURGERY     CESAREAN SECTION      Social History   Socioeconomic History   Marital status: Divorced    Spouse name: Not on file   Number of children: Not on file   Years of education: Not on file   Highest education level: Not on file  Occupational History   Not on file  Tobacco Use   Smoking status: Never   Smokeless tobacco: Never  Vaping Use   Vaping Use: Never used  Substance and Sexual Activity   Alcohol use: Yes    Comment: occassionally   Drug use: Never   Sexual activity: Yes  Other Topics Concern   Not on file  Social History Narrative   Not on file   Social Determinants of Health   Financial Resource Strain: Not on file  Food Insecurity: Not on file  Transportation Needs: Not on file  Physical Activity: Not on file  Stress: Not on file  Social Connections: Not on file  Intimate Partner Violence: Not on file    Family History  Problem Relation Age of Onset   Diabetes Mother    Thyroid disease Mother    Breast cancer Neg Hx      Current Outpatient Medications:    cetirizine (ZYRTEC) 10 MG tablet, Take 1 tablet by mouth daily., Disp: , Rfl:    COVID-19 Specimen Collection KIT, See admin instructions. for testing (Patient not taking: Reported on 04/19/2021), Disp: , Rfl:    cyanocobalamin (,VITAMIN B-12,) 1000 MCG/ML injection, Inject into the muscle. (Patient not taking: Reported on 08/14/2022),  Disp: , Rfl:    cyclobenzaprine (FLEXERIL) 10 MG tablet, Take 10 mg by mouth every 8 (eight) hours as needed. (Patient not taking: Reported on 01/31/2022), Disp: , Rfl:    docusate sodium (COLACE) 100 MG capsule, Take 1 capsule (100 mg total) by mouth daily. (Patient not taking: Reported on 04/12/2020), Disp: 30 capsule, Rfl: 2   ferrous gluconate (FERGON) 324 MG tablet, Take 1 tablet (324 mg total) by mouth 2 (two) times daily with a meal. (Patient not taking:  Reported on 01/04/2020), Disp: 60 tablet, Rfl: 2   fluconazole (DIFLUCAN) 150 MG tablet, Take 150 mg by mouth once. (Patient not taking: Reported on 04/19/2021), Disp: , Rfl:    gabapentin (NEURONTIN) 300 MG capsule, Take 300 mg by mouth daily. (Patient not taking: Reported on 01/31/2022), Disp: , Rfl:    nitrofurantoin, macrocrystal-monohydrate, (MACROBID) 100 MG capsule, Take 100 mg by mouth 2 (two) times daily. (Patient not taking: Reported on 04/19/2021), Disp: , Rfl:   Physical exam:  Vitals:   08/14/22 0900  BP: 102/68  Pulse: 76  Resp: 16  Temp: 99.4 F (37.4 C)  TempSrc: Tympanic  Weight: 180 lb 3.2 oz (81.7 kg)   Physical Exam Constitutional:      General: She is not in acute distress. Cardiovascular:     Rate and Rhythm: Normal rate and regular rhythm.     Heart sounds: Normal heart sounds.  Pulmonary:     Effort: Pulmonary effort is normal.     Breath sounds: Normal breath sounds.  Abdominal:     General: Bowel sounds are normal.     Palpations: Abdomen is soft.  Skin:    General: Skin is warm and dry.  Neurological:     Mental Status: She is alert and oriented to person, place, and time.         Latest Ref Rng & Units 01/09/2017    3:23 PM  CMP  Glucose 65 - 99 mg/dL 75   BUN 6 - 20 mg/dL 9   Creatinine 0.57 - 1.00 mg/dL 0.62   Sodium 134 - 144 mmol/L 141   Potassium 3.5 - 5.2 mmol/L 3.9   Chloride 96 - 106 mmol/L 101   CO2 18 - 29 mmol/L 26   Calcium 8.7 - 10.2 mg/dL 8.7       Latest Ref Rng & Units 08/14/2022    8:57 AM  CBC  WBC 4.0 - 10.5 K/uL 8.5   Hemoglobin 12.0 - 15.0 g/dL 12.4   Hematocrit 36.0 - 46.0 % 39.7   Platelets 150 - 400 K/uL 461      Assessment and plan- Patient is a 41 y.o. female here for routine follow-up for iron and B12 deficiency anemia  Patient is currently on B12 injections.  B12 levels are mildly low at 260 which we will continue to monitor.  She is not presently anemic with a hemoglobin of 12.4.  Ferritin levels are normal  at 46 with an iron saturation of 19%.  She does not require any IV iron at this time.  Repeat CBC ferritin and iron studies in 4 and 8 months and I will see her back in 8 months   Visit Diagnosis 1. Iron deficiency anemia, unspecified iron deficiency anemia type   2. History of gastric bypass      Dr. Randa Evens, MD, MPH [facility] [phone] 08/14/2022 4:44 PM

## 2022-08-14 NOTE — Progress Notes (Signed)
Patient having an increase in fatigue. Also noticing wt gain and wondering if could be related to iron infusions.

## 2022-12-13 ENCOUNTER — Inpatient Hospital Stay: Payer: 59 | Attending: Oncology

## 2022-12-13 DIAGNOSIS — D509 Iron deficiency anemia, unspecified: Secondary | ICD-10-CM | POA: Insufficient documentation

## 2022-12-13 DIAGNOSIS — Z862 Personal history of diseases of the blood and blood-forming organs and certain disorders involving the immune mechanism: Secondary | ICD-10-CM

## 2022-12-13 DIAGNOSIS — D508 Other iron deficiency anemias: Secondary | ICD-10-CM

## 2022-12-13 DIAGNOSIS — K9589 Other complications of other bariatric procedure: Secondary | ICD-10-CM

## 2022-12-13 LAB — IRON AND TIBC
Iron: 42 ug/dL (ref 28–170)
Saturation Ratios: 12 % (ref 10.4–31.8)
TIBC: 365 ug/dL (ref 250–450)
UIBC: 323 ug/dL

## 2022-12-13 LAB — CBC
HCT: 36.8 % (ref 36.0–46.0)
Hemoglobin: 11.5 g/dL — ABNORMAL LOW (ref 12.0–15.0)
MCH: 26 pg (ref 26.0–34.0)
MCHC: 31.3 g/dL (ref 30.0–36.0)
MCV: 83.1 fL (ref 80.0–100.0)
Platelets: 510 10*3/uL — ABNORMAL HIGH (ref 150–400)
RBC: 4.43 MIL/uL (ref 3.87–5.11)
RDW: 13.5 % (ref 11.5–15.5)
WBC: 6.9 10*3/uL (ref 4.0–10.5)
nRBC: 0 % (ref 0.0–0.2)

## 2022-12-13 LAB — FERRITIN: Ferritin: 17 ng/mL (ref 11–307)

## 2022-12-17 ENCOUNTER — Telehealth: Payer: Self-pay | Admitting: Oncology

## 2022-12-17 NOTE — Telephone Encounter (Signed)
pt called in to req Iron Inf appts. States that she had labs drawn with PCP. Please Advise

## 2022-12-20 ENCOUNTER — Inpatient Hospital Stay: Payer: 59

## 2022-12-20 ENCOUNTER — Encounter: Payer: Self-pay | Admitting: Nurse Practitioner

## 2022-12-20 ENCOUNTER — Inpatient Hospital Stay (HOSPITAL_BASED_OUTPATIENT_CLINIC_OR_DEPARTMENT_OTHER): Payer: 59 | Admitting: Nurse Practitioner

## 2022-12-20 VITALS — BP 123/72 | HR 94 | Temp 97.7°F | Resp 18

## 2022-12-20 VITALS — BP 124/79 | HR 82 | Temp 98.7°F | Wt 183.0 lb

## 2022-12-20 DIAGNOSIS — D75839 Thrombocytosis, unspecified: Secondary | ICD-10-CM

## 2022-12-20 DIAGNOSIS — D508 Other iron deficiency anemias: Secondary | ICD-10-CM

## 2022-12-20 DIAGNOSIS — E538 Deficiency of other specified B group vitamins: Secondary | ICD-10-CM

## 2022-12-20 DIAGNOSIS — K9589 Other complications of other bariatric procedure: Secondary | ICD-10-CM | POA: Diagnosis not present

## 2022-12-20 DIAGNOSIS — D509 Iron deficiency anemia, unspecified: Secondary | ICD-10-CM | POA: Diagnosis not present

## 2022-12-20 MED ORDER — ASPIRIN 81 MG PO TBEC: 81.0000 mg | DELAYED_RELEASE_TABLET | Freq: Every day | ORAL | 3 refills | Status: DC

## 2022-12-20 MED ORDER — SODIUM CHLORIDE 0.9 % IV SOLN
200.0000 mg | Freq: Once | INTRAVENOUS | Status: AC
  Administered 2022-12-20: 200 mg via INTRAVENOUS
  Filled 2022-12-20: qty 200

## 2022-12-20 MED ORDER — SODIUM CHLORIDE 0.9 % IV SOLN
Freq: Once | INTRAVENOUS | Status: AC
  Filled 2022-12-20: qty 250

## 2022-12-20 NOTE — Progress Notes (Signed)
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  She is unable to tolerate oral iron and has received IV iron in the past.  She has been on monthly B12 injections.   Interval history- Patient is 42 year old female who returns to clinic for evaluation of iron deficiency anemia. She reports increased fatigue, itchy/dry skin, and cramping. Symptoms are similar to when she has previously experienced iron deficiency. Had one heavy menstrual period. Denies any neurologic complaints. Denies recent fevers or illnesses. Denies any easy bleeding or bruising. No melena or hematochezia. No pica or restless leg. Reports good appetite and denies weight loss. Denies chest pain. Denies any nausea, vomiting, constipation, or diarrhea. Denies urinary complaints. Patient offers no further specific complaints today.  ECOG PS- 0 Pain scale- 0  Review of systems- Review of Systems  Constitutional:  Positive for malaise/fatigue. Negative for chills, fever and weight loss.  HENT:  Negative for congestion, ear discharge and nosebleeds.   Eyes:  Negative for blurred vision.  Respiratory:  Negative for cough, hemoptysis, sputum production, shortness of breath and wheezing.   Cardiovascular:  Negative for chest pain, palpitations, orthopnea and claudication.  Gastrointestinal:  Negative for abdominal pain, blood in stool, constipation, diarrhea, heartburn, melena, nausea and vomiting.  Genitourinary:  Negative for  dysuria, flank pain, frequency, hematuria and urgency.  Musculoskeletal:  Positive for myalgias. Negative for back pain and joint pain.  Skin:  Negative for rash.  Neurological:  Negative for dizziness, tingling, focal weakness, seizures, weakness and headaches.  Endo/Heme/Allergies:  Does not bruise/bleed easily.  Psychiatric/Behavioral:  Negative for depression and suicidal ideas. The patient does not have insomnia.     No Known Allergies  Past Medical History:  Diagnosis Date   Anemia    Calcium deficiency    Past Surgical History:  Procedure Laterality Date   BARIATRIC SURGERY     CESAREAN SECTION     Social History   Socioeconomic History   Marital status: Divorced    Spouse name: Not on file   Number of children: Not on file   Years of education: Not on file   Highest education level: Not on file  Occupational History   Not on file  Tobacco Use   Smoking status: Never   Smokeless tobacco: Never  Vaping Use   Vaping Use: Never used  Substance and Sexual Activity   Alcohol use: Yes    Comment: occassionally   Drug use: Never   Sexual activity: Yes  Other Topics Concern   Not on file  Social History Narrative   Not on file   Social Determinants of Health   Financial Resource Strain: Not on file  Food Insecurity: Not on file  Transportation Needs: Not on file  Physical Activity: Not on file  Stress: Not on file  Social Connections: Not on file  Intimate Partner Violence: Not on file   Family History  Problem Relation Age of Onset   Diabetes Mother    Thyroid disease Mother    Breast cancer Neg Hx  Current Outpatient Medications:    cetirizine (ZYRTEC) 10 MG tablet, Take 1 tablet by mouth daily., Disp: , Rfl:    COVID-19 Specimen Collection KIT, See admin instructions. for testing (Patient not taking: Reported on 04/19/2021), Disp: , Rfl:    cyanocobalamin (,VITAMIN B-12,) 1000 MCG/ML injection, Inject into the muscle. (Patient not taking: Reported  on 08/14/2022), Disp: , Rfl:    cyclobenzaprine (FLEXERIL) 10 MG tablet, Take 10 mg by mouth every 8 (eight) hours as needed. (Patient not taking: Reported on 01/31/2022), Disp: , Rfl:    docusate sodium (COLACE) 100 MG capsule, Take 1 capsule (100 mg total) by mouth daily. (Patient not taking: Reported on 04/12/2020), Disp: 30 capsule, Rfl: 2   ferrous gluconate (FERGON) 324 MG tablet, Take 1 tablet (324 mg total) by mouth 2 (two) times daily with a meal. (Patient not taking: Reported on 01/04/2020), Disp: 60 tablet, Rfl: 2   fluconazole (DIFLUCAN) 150 MG tablet, Take 150 mg by mouth once. (Patient not taking: Reported on 04/19/2021), Disp: , Rfl:    gabapentin (NEURONTIN) 300 MG capsule, Take 300 mg by mouth daily. (Patient not taking: Reported on 01/31/2022), Disp: , Rfl:    nitrofurantoin, macrocrystal-monohydrate, (MACROBID) 100 MG capsule, Take 100 mg by mouth 2 (two) times daily. (Patient not taking: Reported on 04/19/2021), Disp: , Rfl:   Physical exam:  Vitals:   12/20/22 1421  BP: 124/79  Pulse: 82  Temp: 98.7 F (37.1 C)  TempSrc: Tympanic  Weight: 183 lb (83 kg)   Physical Exam Constitutional:      General: She is not in acute distress.    Appearance: She is not ill-appearing.  Cardiovascular:     Rate and Rhythm: Normal rate and regular rhythm.  Pulmonary:     Effort: Pulmonary effort is normal.  Abdominal:     General: There is no distension.     Palpations: Abdomen is soft.     Tenderness: There is no abdominal tenderness.     Comments: Well healed abdominoplasty scars  Skin:    General: Skin is warm.     Coloration: Skin is not pale.     Findings: No rash.  Neurological:     Mental Status: She is alert and oriented to person, place, and time.  Psychiatric:        Mood and Affect: Mood normal.        Behavior: Behavior normal.        Latest Ref Rng & Units 12/13/2022    2:26 PM  CBC  WBC 4.0 - 10.5 K/uL 6.9   Hemoglobin 12.0 - 15.0 g/dL 11.5   Hematocrit 36.0 -  46.0 % 36.8   Platelets 150 - 400 K/uL 510    Iron/TIBC/Ferritin/ %Sat    Component Value Date/Time   IRON 42 12/13/2022 1426   IRON 12 (L) 07/05/2017 0926   TIBC 365 12/13/2022 1426   TIBC 467 (H) 07/05/2017 0926   FERRITIN 17 12/13/2022 1426   IRONPCTSAT 12 12/13/2022 1426   IRONPCTSAT 3 (LL) 07/05/2017 0926     Assessment and plan- Patient is a 42 y.o. female with history of iron and B12 deficiency anemia here for early follow up d/t abnormal lab findings.    Iron deficiency anemia- secondary to menses and malabsorption secondary to gastric bypass. Hemoglobin has dropped to 11.5, normoctic. Ferritin 17, Iron sat 12%. Symptomatic. Plan for venofer 200 mg IV x 3.   Thrombocytosis- plt 510 today. She has had increased platelets over past 2+  years but more so today. IDA may cause slight increase in platelets but I plan to add on MPN labs for next visit to evaluate further. No hx of blood clots. Recommend aspirin 81 mg daily.    B12 Deficiency- Last received b12 injections April 2023. On oral b12 though I question if she'll be able to absorb this given her history of gastric bypass. B12 was 260 at last draw. Repeat at next visit.  Disposition:  Venofer x 3 (first one is today) Follow up as scheduled- la    Visit Diagnosis 1. Iron deficiency anemia following bariatric surgery   2. Thrombocytosis   3. B12 deficiency    Beckey Rutter, DNP, AGNP-C, [place] [facility] [phone] (clinic) 12/20/2022

## 2022-12-25 MED FILL — Iron Sucrose Inj 20 MG/ML (Fe Equiv): INTRAVENOUS | Qty: 10 | Status: AC

## 2022-12-26 ENCOUNTER — Inpatient Hospital Stay: Payer: 59

## 2022-12-26 VITALS — BP 123/78 | HR 83 | Temp 97.9°F | Resp 16

## 2022-12-26 DIAGNOSIS — D508 Other iron deficiency anemias: Secondary | ICD-10-CM

## 2022-12-26 DIAGNOSIS — D509 Iron deficiency anemia, unspecified: Secondary | ICD-10-CM | POA: Diagnosis not present

## 2022-12-26 MED ORDER — SODIUM CHLORIDE 0.9 % IV SOLN
Freq: Once | INTRAVENOUS | Status: AC
  Filled 2022-12-26: qty 250

## 2022-12-26 MED ORDER — SODIUM CHLORIDE 0.9 % IV SOLN
200.0000 mg | Freq: Once | INTRAVENOUS | Status: AC
  Administered 2022-12-26: 200 mg via INTRAVENOUS
  Filled 2022-12-26: qty 200

## 2022-12-28 ENCOUNTER — Inpatient Hospital Stay: Payer: 59

## 2022-12-28 VITALS — BP 119/75 | HR 87 | Temp 98.3°F | Resp 16

## 2022-12-28 DIAGNOSIS — D509 Iron deficiency anemia, unspecified: Secondary | ICD-10-CM | POA: Diagnosis not present

## 2022-12-28 DIAGNOSIS — K9589 Other complications of other bariatric procedure: Secondary | ICD-10-CM

## 2022-12-28 MED ORDER — SODIUM CHLORIDE 0.9 % IV SOLN
200.0000 mg | Freq: Once | INTRAVENOUS | Status: AC
  Administered 2022-12-28: 200 mg via INTRAVENOUS
  Filled 2022-12-28: qty 200

## 2022-12-28 MED ORDER — SODIUM CHLORIDE 0.9 % IV SOLN
INTRAVENOUS | Status: DC
  Filled 2022-12-28: qty 250

## 2022-12-28 NOTE — Progress Notes (Signed)
Pt has been educated and understands. Pt refused to stay 30 mins after iron infusion. VSS. 

## 2023-02-04 ENCOUNTER — Other Ambulatory Visit: Payer: Self-pay | Admitting: Family Medicine

## 2023-02-04 DIAGNOSIS — Z1231 Encounter for screening mammogram for malignant neoplasm of breast: Secondary | ICD-10-CM

## 2023-02-12 ENCOUNTER — Ambulatory Visit
Admission: RE | Admit: 2023-02-12 | Discharge: 2023-02-12 | Disposition: A | Discharge status: Home / Self Care | Payer: 59 | Source: Ambulatory Visit | Attending: Family Medicine | Admitting: Family Medicine

## 2023-02-12 DIAGNOSIS — Z1231 Encounter for screening mammogram for malignant neoplasm of breast: Secondary | ICD-10-CM | POA: Diagnosis present

## 2023-04-15 ENCOUNTER — Ambulatory Visit: Payer: 59 | Admitting: Oncology

## 2023-04-15 ENCOUNTER — Other Ambulatory Visit: Payer: 59

## 2023-04-16 ENCOUNTER — Inpatient Hospital Stay: Payer: 59 | Attending: Oncology | Admitting: Oncology

## 2023-04-16 ENCOUNTER — Encounter: Payer: Self-pay | Admitting: *Deleted

## 2023-04-16 ENCOUNTER — Encounter: Payer: Self-pay | Admitting: Oncology

## 2023-04-16 ENCOUNTER — Inpatient Hospital Stay: Payer: 59

## 2023-04-16 VITALS — BP 123/84 | HR 87 | Temp 96.4°F | Ht 64.0 in | Wt 190.3 lb

## 2023-04-16 DIAGNOSIS — D509 Iron deficiency anemia, unspecified: Secondary | ICD-10-CM | POA: Insufficient documentation

## 2023-04-16 DIAGNOSIS — D519 Vitamin B12 deficiency anemia, unspecified: Secondary | ICD-10-CM | POA: Diagnosis not present

## 2023-04-16 DIAGNOSIS — E538 Deficiency of other specified B group vitamins: Secondary | ICD-10-CM

## 2023-04-16 DIAGNOSIS — Z862 Personal history of diseases of the blood and blood-forming organs and certain disorders involving the immune mechanism: Secondary | ICD-10-CM | POA: Diagnosis not present

## 2023-04-16 DIAGNOSIS — D508 Other iron deficiency anemias: Secondary | ICD-10-CM

## 2023-04-16 DIAGNOSIS — D75839 Thrombocytosis, unspecified: Secondary | ICD-10-CM

## 2023-04-16 LAB — VITAMIN B12: Vitamin B-12: 231 pg/mL (ref 180–914)

## 2023-04-16 LAB — IRON AND TIBC
Iron: 89 ug/dL (ref 28–170)
Saturation Ratios: 27 % (ref 10.4–31.8)
TIBC: 330 ug/dL (ref 250–450)
UIBC: 241 ug/dL

## 2023-04-16 LAB — COMPREHENSIVE METABOLIC PANEL
ALT: 12 U/L (ref 0–44)
AST: 20 U/L (ref 15–41)
Albumin: 3.6 g/dL (ref 3.5–5.0)
Alkaline Phosphatase: 118 U/L (ref 38–126)
Anion gap: 9 (ref 5–15)
BUN: 11 mg/dL (ref 6–20)
CO2: 25 mmol/L (ref 22–32)
Calcium: 8.6 mg/dL — ABNORMAL LOW (ref 8.9–10.3)
Chloride: 103 mmol/L (ref 98–111)
Creatinine, Ser: 0.61 mg/dL (ref 0.44–1.00)
GFR, Estimated: >60 mL/min (ref >60)
Glucose, Bld: 156 mg/dL — ABNORMAL HIGH (ref 70–99)
Potassium: 3.8 mmol/L (ref 3.5–5.1)
Sodium: 137 mmol/L (ref 135–145)
Total Bilirubin: 0.2 mg/dL — ABNORMAL LOW (ref 0.3–1.2)
Total Protein: 7.2 g/dL (ref 6.5–8.1)

## 2023-04-16 LAB — TECHNOLOGIST SMEAR REVIEW: Plt Morphology: INCREASED

## 2023-04-16 LAB — CBC WITH DIFFERENTIAL/PLATELET
Abs Immature Granulocytes: 0.03 10*3/uL (ref 0.00–0.07)
Basophils Absolute: 0.1 10*3/uL (ref 0.0–0.1)
Basophils Relative: 1 %
Eosinophils Absolute: 0.3 10*3/uL (ref 0.0–0.5)
Eosinophils Relative: 4 %
HCT: 38.1 % (ref 36.0–46.0)
Hemoglobin: 11.7 g/dL — ABNORMAL LOW (ref 12.0–15.0)
Immature Granulocytes: 0 %
Lymphocytes Relative: 33 %
Lymphs Abs: 2.5 10*3/uL (ref 0.7–4.0)
MCH: 26.3 pg (ref 26.0–34.0)
MCHC: 30.7 g/dL (ref 30.0–36.0)
MCV: 85.6 fL (ref 80.0–100.0)
Monocytes Absolute: 0.4 10*3/uL (ref 0.1–1.0)
Monocytes Relative: 5 %
Neutro Abs: 4.3 10*3/uL (ref 1.7–7.7)
Neutrophils Relative %: 57 %
Platelets: 471 10*3/uL — ABNORMAL HIGH (ref 150–400)
RBC: 4.45 MIL/uL (ref 3.87–5.11)
RDW: 13.6 % (ref 11.5–15.5)
WBC: 7.5 10*3/uL (ref 4.0–10.5)
nRBC: 0 % (ref 0.0–0.2)

## 2023-04-16 LAB — FERRITIN: Ferritin: 37 ng/mL (ref 11–307)

## 2023-04-16 MED ORDER — CYANOCOBALAMIN 1000 MCG/ML IJ SOLN
1000.0000 ug | Freq: Once | INTRAMUSCULAR | Status: AC
  Administered 2023-04-16: 1000 ug via INTRAMUSCULAR
  Filled 2023-04-16: qty 1

## 2023-04-16 NOTE — Progress Notes (Signed)
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  She is unable to tolerate oral iron and has received IV iron in the past.  She has been on monthly B12 injections.  Patient has also had mild chronic thrombocytosis with a platelet count of his typically fluctuated in the 400s  Interval history-reports feeling more fatigued as compared to her baseline.  Menstrual cycles can be heavy at times.  ECOG PS- 0 Pain scale- 0   Review of systems- Review of Systems  Constitutional:  Positive for malaise/fatigue. Negative for chills, fever and weight loss.  HENT:  Negative for congestion, ear discharge and nosebleeds.   Eyes:  Negative for blurred vision.  Respiratory:  Negative for cough, hemoptysis, sputum production, shortness of breath and wheezing.   Cardiovascular:  Negative for chest pain, palpitations, orthopnea and claudication.  Gastrointestinal:  Negative for abdominal pain, blood in stool, constipation, diarrhea, heartburn, melena, nausea and vomiting.  Genitourinary:  Negative for dysuria, flank pain, frequency, hematuria and urgency.  Musculoskeletal:  Negative for back pain, joint pain and myalgias.  Skin:  Negative for rash.  Neurological:  Negative for dizziness, tingling, focal weakness, seizures, weakness and headaches.  Endo/Heme/Allergies:  Does not bruise/bleed easily.  Psychiatric/Behavioral:  Negative for depression and suicidal ideas. The patient does not have  insomnia.       No Known Allergies   Past Medical History:  Diagnosis Date   Anemia    Calcium deficiency      Past Surgical History:  Procedure Laterality Date   BARIATRIC SURGERY     CESAREAN SECTION      Social History   Socioeconomic History   Marital status: Divorced    Spouse name: Not on file   Number of children: Not on file   Years of education: Not on file   Highest education level: Not on file  Occupational History   Not on file  Tobacco Use   Smoking status: Never   Smokeless tobacco: Never  Vaping Use   Vaping Use: Never used  Substance and Sexual Activity   Alcohol use: Yes    Comment: occassionally   Drug use: Never   Sexual activity: Yes  Other Topics Concern   Not on file  Social History Narrative   Not on file   Social Determinants of Health   Financial Resource Strain: Not on file  Food Insecurity: Not on file  Transportation Needs: Not on file  Physical Activity: Not on file  Stress: Not on file  Social Connections: Not on file  Intimate Partner Violence: Not on file    Family History  Problem Relation Age of Onset   Diabetes Mother    Thyroid disease Mother    Breast cancer Neg Hx      Current Outpatient Medications:    cetirizine (ZYRTEC) 10 MG tablet, Take 1 tablet by mouth daily., Disp: , Rfl:   Physical exam:  Vitals:   04/16/23 0903  BP: 123/84  Pulse: 87  Temp: (!) 96.4 F (35.8 C)  TempSrc:  Tympanic  SpO2: 100%  Weight: 190 lb 4.8 oz (86.3 kg)  Height: 5\' 4"  (1.626 m)   Physical Exam Cardiovascular:     Rate and Rhythm: Normal rate and regular rhythm.     Heart sounds: Normal heart sounds.  Pulmonary:     Effort: Pulmonary effort is normal.     Breath sounds: Normal breath sounds.  Abdominal:     General: Bowel sounds are normal.     Palpations: Abdomen is soft.  Skin:    General: Skin is warm and dry.  Neurological:     Mental Status: She is alert and oriented to person, place, and time.          Latest Ref Rng & Units 04/16/2023    9:05 AM  CMP  Glucose 70 - 99 mg/dL 161   BUN 6 - 20 mg/dL 11   Creatinine 0.96 - 1.00 mg/dL 0.45   Sodium 409 - 811 mmol/L 137   Potassium 3.5 - 5.1 mmol/L 3.8   Chloride 98 - 111 mmol/L 103   CO2 22 - 32 mmol/L 25   Calcium 8.9 - 10.3 mg/dL 8.6   Total Protein 6.5 - 8.1 g/dL 7.2   Total Bilirubin 0.3 - 1.2 mg/dL 0.2   Alkaline Phos 38 - 126 U/L 118   AST 15 - 41 U/L 20   ALT 0 - 44 U/L 12       Latest Ref Rng & Units 04/16/2023    9:05 AM  CBC  WBC 4.0 - 10.5 K/uL 7.5   Hemoglobin 12.0 - 15.0 g/dL 91.4   Hematocrit 78.2 - 46.0 % 38.1   Platelets 150 - 400 K/uL 471      Assessment and plan- Patient is a 42 y.o. female with history of iron and B12 deficiency anemia here for routine follow-up  Patient is mildly anemic with a hemoglobin of 11.7.  Iron studies are pending and based on that we will decide if she needs IV iron.  Patient has been previously receiving B12 injections and we will resume those monthly moving forward.  Thrombocytosis: This has been chronic with her platelet counts which have been typically in the mid to high 400s without a clear rising trend.  No prior history of thrombosis.  I do not think that we are dealing with a myeloproliferative disorder given the stability of her platelet counts.  JAK2 mutation testing was ordered today but was not drawn.  BCR-ABL FISH testing is pending.  There is a continued increase in her platelet counts I will consider getting additional workup.  Given that she is less than 21 years of age with no prior history of thrombosis or diagnosis of myeloproliferative disorder would not change management at this time.  She has had thrombocytosis even when her iron levels have been normal   Visit Diagnosis 1. History of iron deficiency anemia   2. B12 deficiency      Dr. Owens Shark, MD, MPH [facility] [phone] 04/16/2023 12:59 PM

## 2023-04-16 NOTE — Progress Notes (Signed)
C/o fatigue.  Needing suggestions on how to come off of [employer].

## 2023-04-20 LAB — BCR-ABL1 FISH
Cells Analyzed: 200
Cells Counted: 200

## 2023-05-13 ENCOUNTER — Inpatient Hospital Stay: Payer: 59 | Attending: Oncology

## 2023-05-13 DIAGNOSIS — K9589 Other complications of other bariatric procedure: Secondary | ICD-10-CM

## 2023-05-13 DIAGNOSIS — D519 Vitamin B12 deficiency anemia, unspecified: Secondary | ICD-10-CM | POA: Diagnosis present

## 2023-05-13 MED ORDER — CYANOCOBALAMIN 1000 MCG/ML IJ SOLN
1000.0000 ug | INTRAMUSCULAR | Status: DC
  Administered 2023-05-13: 1000 ug via INTRAMUSCULAR
  Filled 2023-05-13: qty 1

## 2023-05-14 ENCOUNTER — Inpatient Hospital Stay: Payer: 59

## 2023-06-14 ENCOUNTER — Encounter: Payer: Self-pay | Admitting: Hematology and Oncology

## 2023-06-14 ENCOUNTER — Inpatient Hospital Stay: Payer: 59 | Attending: Oncology

## 2023-06-14 ENCOUNTER — Inpatient Hospital Stay: Payer: 59

## 2023-06-14 DIAGNOSIS — D519 Vitamin B12 deficiency anemia, unspecified: Secondary | ICD-10-CM | POA: Diagnosis present

## 2023-06-14 DIAGNOSIS — K9589 Other complications of other bariatric procedure: Secondary | ICD-10-CM

## 2023-06-14 MED ORDER — CYANOCOBALAMIN 1000 MCG/ML IJ SOLN
1000.0000 ug | INTRAMUSCULAR | Status: DC
  Administered 2023-06-14: 1000 ug via INTRAMUSCULAR
  Filled 2023-06-14: qty 1

## 2023-07-15 ENCOUNTER — Inpatient Hospital Stay: Payer: 59

## 2023-07-15 ENCOUNTER — Inpatient Hospital Stay: Payer: 59 | Attending: Oncology

## 2023-07-15 DIAGNOSIS — Z862 Personal history of diseases of the blood and blood-forming organs and certain disorders involving the immune mechanism: Secondary | ICD-10-CM

## 2023-07-15 DIAGNOSIS — D509 Iron deficiency anemia, unspecified: Secondary | ICD-10-CM | POA: Diagnosis present

## 2023-07-15 DIAGNOSIS — E538 Deficiency of other specified B group vitamins: Secondary | ICD-10-CM

## 2023-07-15 DIAGNOSIS — D508 Other iron deficiency anemias: Secondary | ICD-10-CM

## 2023-07-15 DIAGNOSIS — D519 Vitamin B12 deficiency anemia, unspecified: Secondary | ICD-10-CM | POA: Insufficient documentation

## 2023-07-15 LAB — IRON AND TIBC
Iron: 52 ug/dL (ref 28–170)
Saturation Ratios: 14 % (ref 10.4–31.8)
TIBC: 370 ug/dL (ref 250–450)
UIBC: 318 ug/dL

## 2023-07-15 LAB — CBC
HCT: 35 % — ABNORMAL LOW (ref 36.0–46.0)
Hemoglobin: 10.9 g/dL — ABNORMAL LOW (ref 12.0–15.0)
MCH: 25.9 pg — ABNORMAL LOW (ref 26.0–34.0)
MCHC: 31.1 g/dL (ref 30.0–36.0)
MCV: 83.1 fL (ref 80.0–100.0)
Platelets: 429 10*3/uL — ABNORMAL HIGH (ref 150–400)
RBC: 4.21 MIL/uL (ref 3.87–5.11)
RDW: 13.2 % (ref 11.5–15.5)
WBC: 9 10*3/uL (ref 4.0–10.5)
nRBC: 0 % (ref 0.0–0.2)

## 2023-07-15 LAB — FERRITIN: Ferritin: 10 ng/mL — ABNORMAL LOW (ref 11–307)

## 2023-07-15 MED ORDER — CYANOCOBALAMIN 1000 MCG/ML IJ SOLN
1000.0000 ug | INTRAMUSCULAR | Status: DC
  Administered 2023-07-15: 1000 ug via INTRAMUSCULAR
  Filled 2023-07-15: qty 1

## 2023-07-19 ENCOUNTER — Inpatient Hospital Stay: Payer: 59

## 2023-07-19 VITALS — BP 141/84 | HR 96 | Temp 97.3°F | Resp 18

## 2023-07-19 DIAGNOSIS — D509 Iron deficiency anemia, unspecified: Secondary | ICD-10-CM | POA: Diagnosis not present

## 2023-07-19 DIAGNOSIS — D508 Other iron deficiency anemias: Secondary | ICD-10-CM

## 2023-07-19 MED ORDER — SODIUM CHLORIDE 0.9 % IV SOLN
INTRAVENOUS | Status: DC
  Filled 2023-07-19: qty 250

## 2023-07-19 MED ORDER — SODIUM CHLORIDE 0.9 % IV SOLN
200.0000 mg | Freq: Once | INTRAVENOUS | Status: AC
  Administered 2023-07-19: 200 mg via INTRAVENOUS
  Filled 2023-07-19: qty 200

## 2023-07-19 NOTE — Patient Instructions (Signed)
Iron Sucrose Injection What is this medication? IRON SUCROSE (EYE ern SOO krose) treats low levels of iron (iron deficiency anemia) in people with kidney disease. Iron is a mineral that plays an important role in making red blood cells, which carry oxygen from your lungs to the rest of your body. This medicine may be used for other purposes; ask your health care provider or pharmacist if you have questions. COMMON BRAND NAME(S): Venofer What should I tell my care team before I take this medication? They need to know if you have any of these conditions: Anemia not caused by low iron levels Heart disease High levels of iron in the blood Kidney disease Liver disease An unusual or allergic reaction to iron, other medications, foods, dyes, or preservatives Pregnant or trying to get pregnant Breastfeeding How should I use this medication? This medication is for infusion into a vein. It is given in a hospital or clinic setting. Talk to your care team about the use of this medication in children. While this medication may be prescribed for children as young as 2 years for selected conditions, precautions do apply. Overdosage: If you think you have taken too much of this medicine contact a poison control center or emergency room at once. NOTE: This medicine is only for you. Do not share this medicine with others. What if I miss a dose? Keep appointments for follow-up doses. It is important not to miss your dose. Call your care team if you are unable to keep an appointment. What may interact with this medication? Do not take this medication with any of the following: Deferoxamine Dimercaprol Other iron products This medication may also interact with the following: Chloramphenicol Deferasirox This list may not describe all possible interactions. Give your health care provider a list of all the medicines, herbs, non-prescription drugs, or dietary supplements you use. Also tell them if you smoke,  drink alcohol, or use illegal drugs. Some items may interact with your medicine. What should I watch for while using this medication? Visit your care team regularly. Tell your care team if your symptoms do not start to get better or if they get worse. You may need blood work done while you are taking this medication. You may need to follow a special diet. Talk to your care team. Foods that contain iron include: whole grains/cereals, dried fruits, beans, or peas, leafy green vegetables, and organ meats (liver, kidney). What side effects may I notice from receiving this medication? Side effects that you should report to your care team as soon as possible: Allergic reactions--skin rash, itching, hives, swelling of the face, lips, tongue, or throat Low blood pressure--dizziness, feeling faint or lightheaded, blurry vision Shortness of breath Side effects that usually do not require medical attention (report to your care team if they continue or are bothersome): Flushing Headache Joint pain Muscle pain Nausea Pain, redness, or irritation at injection site This list may not describe all possible side effects. Call your doctor for medical advice about side effects. You may report side effects to FDA at 1-800-FDA-1088. Where should I keep my medication? This medication is given in a hospital or clinic. It will not be stored at home. NOTE: This sheet is a summary. It may not cover all possible information. If you have questions about this medicine, talk to your doctor, pharmacist, or health care provider.  2024 Elsevier/Gold Standard (2023-03-01 00:00:00)

## 2023-07-26 ENCOUNTER — Inpatient Hospital Stay: Payer: 59

## 2023-07-26 VITALS — BP 110/58 | HR 88 | Temp 96.0°F | Resp 16

## 2023-07-26 DIAGNOSIS — D509 Iron deficiency anemia, unspecified: Secondary | ICD-10-CM | POA: Diagnosis not present

## 2023-07-26 DIAGNOSIS — D508 Other iron deficiency anemias: Secondary | ICD-10-CM

## 2023-07-26 MED ORDER — SODIUM CHLORIDE 0.9 % IV SOLN
INTRAVENOUS | Status: DC
  Filled 2023-07-26: qty 250

## 2023-07-26 MED ORDER — SODIUM CHLORIDE 0.9 % IV SOLN
200.0000 mg | Freq: Once | INTRAVENOUS | Status: AC
  Administered 2023-07-26: 200 mg via INTRAVENOUS
  Filled 2023-07-26: qty 200

## 2023-07-26 NOTE — Patient Instructions (Signed)
Iron Sucrose Injection What is this medication? IRON SUCROSE (EYE ern SOO krose) treats low levels of iron (iron deficiency anemia) in people with kidney disease. Iron is a mineral that plays an important role in making red blood cells, which carry oxygen from your lungs to the rest of your body. This medicine may be used for other purposes; ask your health care provider or pharmacist if you have questions. COMMON BRAND NAME(S): Venofer What should I tell my care team before I take this medication? They need to know if you have any of these conditions: Anemia not caused by low iron levels Heart disease High levels of iron in the blood Kidney disease Liver disease An unusual or allergic reaction to iron, other medications, foods, dyes, or preservatives Pregnant or trying to get pregnant Breastfeeding How should I use this medication? This medication is for infusion into a vein. It is given in a hospital or clinic setting. Talk to your care team about the use of this medication in children. While this medication may be prescribed for children as young as 2 years for selected conditions, precautions do apply. Overdosage: If you think you have taken too much of this medicine contact a poison control center or emergency room at once. NOTE: This medicine is only for you. Do not share this medicine with others. What if I miss a dose? Keep appointments for follow-up doses. It is important not to miss your dose. Call your care team if you are unable to keep an appointment. What may interact with this medication? Do not take this medication with any of the following: Deferoxamine Dimercaprol Other iron products This medication may also interact with the following: Chloramphenicol Deferasirox This list may not describe all possible interactions. Give your health care provider a list of all the medicines, herbs, non-prescription drugs, or dietary supplements you use. Also tell them if you smoke,  drink alcohol, or use illegal drugs. Some items may interact with your medicine. What should I watch for while using this medication? Visit your care team regularly. Tell your care team if your symptoms do not start to get better or if they get worse. You may need blood work done while you are taking this medication. You may need to follow a special diet. Talk to your care team. Foods that contain iron include: whole grains/cereals, dried fruits, beans, or peas, leafy green vegetables, and organ meats (liver, kidney). What side effects may I notice from receiving this medication? Side effects that you should report to your care team as soon as possible: Allergic reactions--skin rash, itching, hives, swelling of the face, lips, tongue, or throat Low blood pressure--dizziness, feeling faint or lightheaded, blurry vision Shortness of breath Side effects that usually do not require medical attention (report to your care team if they continue or are bothersome): Flushing Headache Joint pain Muscle pain Nausea Pain, redness, or irritation at injection site This list may not describe all possible side effects. Call your doctor for medical advice about side effects. You may report side effects to FDA at 1-800-FDA-1088. Where should I keep my medication? This medication is given in a hospital or clinic. It will not be stored at home. NOTE: This sheet is a summary. It may not cover all possible information. If you have questions about this medicine, talk to your doctor, pharmacist, or health care provider.  2024 Elsevier/Gold Standard (2023-03-01 00:00:00)

## 2023-07-31 ENCOUNTER — Encounter: Payer: Self-pay | Admitting: Hematology and Oncology

## 2023-08-02 ENCOUNTER — Inpatient Hospital Stay: Payer: 59

## 2023-08-02 VITALS — BP 111/66 | HR 89 | Temp 97.4°F | Resp 18

## 2023-08-02 DIAGNOSIS — D508 Other iron deficiency anemias: Secondary | ICD-10-CM

## 2023-08-02 DIAGNOSIS — D509 Iron deficiency anemia, unspecified: Secondary | ICD-10-CM | POA: Diagnosis not present

## 2023-08-02 MED ORDER — IRON SUCROSE 20 MG/ML IV SOLN
200.0000 mg | Freq: Once | INTRAVENOUS | Status: AC
  Administered 2023-08-02: 200 mg via INTRAVENOUS

## 2023-08-02 NOTE — Patient Instructions (Signed)
Iron Sucrose Injection What is this medication? IRON SUCROSE (EYE ern SOO krose) treats low levels of iron (iron deficiency anemia) in people with kidney disease. Iron is a mineral that plays an important role in making red blood cells, which carry oxygen from your lungs to the rest of your body. This medicine may be used for other purposes; ask your health care provider or pharmacist if you have questions. COMMON BRAND NAME(S): Venofer What should I tell my care team before I take this medication? They need to know if you have any of these conditions: Anemia not caused by low iron levels Heart disease High levels of iron in the blood Kidney disease Liver disease An unusual or allergic reaction to iron, other medications, foods, dyes, or preservatives Pregnant or trying to get pregnant Breastfeeding How should I use this medication? This medication is for infusion into a vein. It is given in a hospital or clinic setting. Talk to your care team about the use of this medication in children. While this medication may be prescribed for children as young as 2 years for selected conditions, precautions do apply. Overdosage: If you think you have taken too much of this medicine contact a poison control center or emergency room at once. NOTE: This medicine is only for you. Do not share this medicine with others. What if I miss a dose? Keep appointments for follow-up doses. It is important not to miss your dose. Call your care team if you are unable to keep an appointment. What may interact with this medication? Do not take this medication with any of the following: Deferoxamine Dimercaprol Other iron products This medication may also interact with the following: Chloramphenicol Deferasirox This list may not describe all possible interactions. Give your health care provider a list of all the medicines, herbs, non-prescription drugs, or dietary supplements you use. Also tell them if you smoke,  drink alcohol, or use illegal drugs. Some items may interact with your medicine. What should I watch for while using this medication? Visit your care team regularly. Tell your care team if your symptoms do not start to get better or if they get worse. You may need blood work done while you are taking this medication. You may need to follow a special diet. Talk to your care team. Foods that contain iron include: whole grains/cereals, dried fruits, beans, or peas, leafy green vegetables, and organ meats (liver, kidney). What side effects may I notice from receiving this medication? Side effects that you should report to your care team as soon as possible: Allergic reactions--skin rash, itching, hives, swelling of the face, lips, tongue, or throat Low blood pressure--dizziness, feeling faint or lightheaded, blurry vision Shortness of breath Side effects that usually do not require medical attention (report to your care team if they continue or are bothersome): Flushing Headache Joint pain Muscle pain Nausea Pain, redness, or irritation at injection site This list may not describe all possible side effects. Call your doctor for medical advice about side effects. You may report side effects to FDA at 1-800-FDA-1088. Where should I keep my medication? This medication is given in a hospital or clinic. It will not be stored at home. NOTE: This sheet is a summary. It may not cover all possible information. If you have questions about this medicine, talk to your doctor, pharmacist, or health care provider.  2024 Elsevier/Gold Standard (2023-03-01 00:00:00)

## 2023-08-07 ENCOUNTER — Inpatient Hospital Stay: Payer: 59

## 2023-08-07 NOTE — Progress Notes (Signed)
Patient refused venofer injection today. Patient states that last dose was injected and states "it messed up my arm all week. I have to get these the rest of my life and I don't want my veins messed up." Explained to patient new venofer administration guidelines. Suggested that patient discuss other options with her MD and other formulations of iron that might be covered by her insurance. Pt wanted to speak to management. Provided with contact information.

## 2023-08-09 ENCOUNTER — Encounter: Payer: Self-pay | Admitting: Oncology

## 2023-08-09 ENCOUNTER — Telehealth: Payer: Self-pay | Admitting: *Deleted

## 2023-08-09 ENCOUNTER — Inpatient Hospital Stay: Payer: 59

## 2023-08-09 NOTE — Telephone Encounter (Signed)
Error, pt sent me my chart, so I will send her help with my chart

## 2023-08-12 ENCOUNTER — Encounter: Payer: Self-pay | Admitting: *Deleted

## 2023-08-13 ENCOUNTER — Ambulatory Visit: Payer: 59

## 2023-08-13 ENCOUNTER — Inpatient Hospital Stay: Payer: 59 | Attending: Oncology

## 2023-08-13 ENCOUNTER — Telehealth: Payer: Self-pay | Admitting: Oncology

## 2023-08-13 DIAGNOSIS — D519 Vitamin B12 deficiency anemia, unspecified: Secondary | ICD-10-CM | POA: Diagnosis present

## 2023-08-13 DIAGNOSIS — K9589 Other complications of other bariatric procedure: Secondary | ICD-10-CM

## 2023-08-13 MED ORDER — CYANOCOBALAMIN 1000 MCG/ML IJ SOLN
1000.0000 ug | INTRAMUSCULAR | Status: DC
Start: 2023-08-13 — End: 2023-08-13
  Administered 2023-08-13: 1000 ug via INTRAMUSCULAR
  Filled 2023-08-13: qty 1

## 2023-08-13 NOTE — Telephone Encounter (Signed)
Called patient to follow-up on conversation we had this morning concerning the way her iron infusion are being administered and that her iron appointment today was canceled without anyone speaking to her first. The patient did acknowledge that she did refuse her last infusion because she didn't feel comfortable with the way it was going to be given. Our clinical teams are now giving the treatment she gets by pushing it through the vein over a certain period of time. The patient would prefer not to receive her treatment that way and would like to go back to her original method which was transfused via IV with fluids. I spoke with the AD of nursing and she is going to see what we can do to accommodate the patient request. Ms. Vankirk also expressed that her infusion appointment for today was canceled without someone notifying her. I assured her that the cancellation must have been a miscommunication and we would look into what happen. Upon review of the Mychart communication, it looks like the clinical nurse misread the response from Ms. Sebastiani as the patient asked to have her B-12 injection along with the infusion today. I apologized for the miscommunication and Ms. Christon was instructed to contact us to reschedule her appointments is she was okay with receiving the treatment via push until we could come up with a solution to her concern.

## 2023-08-14 ENCOUNTER — Inpatient Hospital Stay: Payer: 59

## 2023-08-14 ENCOUNTER — Encounter: Payer: Self-pay | Admitting: *Deleted

## 2023-08-14 ENCOUNTER — Telehealth: Payer: Self-pay | Admitting: *Deleted

## 2023-08-14 NOTE — Telephone Encounter (Signed)
Received FMLA form for this patient. Form completed and sent for physician signature

## 2023-08-14 NOTE — Telephone Encounter (Signed)
Form signed and copied fpr chart. Left message on voice mail that she can pick up form at front desk

## 2023-08-16 ENCOUNTER — Inpatient Hospital Stay: Payer: 59

## 2023-08-22 ENCOUNTER — Telehealth: Payer: Self-pay | Admitting: *Deleted

## 2023-08-22 NOTE — Telephone Encounter (Signed)
Patient called back today and she said that she got 3 venofers and and feeling good and she will let us know if she feels like she needs another IV iron and will talk about how to get it through push medicine.

## 2023-08-22 NOTE — Telephone Encounter (Signed)
Called the patient and left a voicemail about if she wanted to try to do the Venofer again and they are going to be pushing it but Selena Batten our Production designer, theatre/television/film said that they could take and put normal saline 2 of them they are 10 mL each 1 and you can do 2 of them to make it more not a strong and hopefully that would be taking care of you being able to get it and and hope that that adding that and there would help you about symptoms from it.  Asked her to give me a call back if she wants to try that

## 2023-09-13 ENCOUNTER — Inpatient Hospital Stay: Payer: 59

## 2023-09-13 ENCOUNTER — Inpatient Hospital Stay: Payer: 59 | Attending: Oncology

## 2023-09-13 DIAGNOSIS — D519 Vitamin B12 deficiency anemia, unspecified: Secondary | ICD-10-CM | POA: Insufficient documentation

## 2023-09-13 DIAGNOSIS — D508 Other iron deficiency anemias: Secondary | ICD-10-CM

## 2023-09-13 MED ORDER — CYANOCOBALAMIN 1000 MCG/ML IJ SOLN
1000.0000 ug | INTRAMUSCULAR | Status: DC
Start: 2023-09-13 — End: 2023-09-13
  Administered 2023-09-13: 1000 ug via INTRAMUSCULAR
  Filled 2023-09-13: qty 1

## 2023-10-10 ENCOUNTER — Encounter: Payer: Self-pay | Admitting: Hematology and Oncology

## 2023-10-14 ENCOUNTER — Inpatient Hospital Stay: Payer: 59

## 2023-10-14 ENCOUNTER — Encounter: Payer: Self-pay | Admitting: Nurse Practitioner

## 2023-10-14 ENCOUNTER — Inpatient Hospital Stay: Payer: 59 | Attending: Oncology

## 2023-10-14 ENCOUNTER — Inpatient Hospital Stay (HOSPITAL_BASED_OUTPATIENT_CLINIC_OR_DEPARTMENT_OTHER): Payer: 59 | Admitting: Nurse Practitioner

## 2023-10-14 VITALS — BP 120/74 | HR 82 | Temp 99.2°F | Resp 18 | Wt 197.0 lb

## 2023-10-14 DIAGNOSIS — D508 Other iron deficiency anemias: Secondary | ICD-10-CM

## 2023-10-14 DIAGNOSIS — D509 Iron deficiency anemia, unspecified: Secondary | ICD-10-CM | POA: Diagnosis not present

## 2023-10-14 DIAGNOSIS — K9589 Other complications of other bariatric procedure: Secondary | ICD-10-CM

## 2023-10-14 DIAGNOSIS — E538 Deficiency of other specified B group vitamins: Secondary | ICD-10-CM

## 2023-10-14 DIAGNOSIS — D75839 Thrombocytosis, unspecified: Secondary | ICD-10-CM

## 2023-10-14 DIAGNOSIS — D519 Vitamin B12 deficiency anemia, unspecified: Secondary | ICD-10-CM | POA: Insufficient documentation

## 2023-10-14 DIAGNOSIS — Z862 Personal history of diseases of the blood and blood-forming organs and certain disorders involving the immune mechanism: Secondary | ICD-10-CM

## 2023-10-14 LAB — CBC
HCT: 37.4 % (ref 36.0–46.0)
Hemoglobin: 12 g/dL (ref 12.0–15.0)
MCH: 26.3 pg (ref 26.0–34.0)
MCHC: 32.1 g/dL (ref 30.0–36.0)
MCV: 82 fL (ref 80.0–100.0)
Platelets: 393 10*3/uL (ref 150–400)
RBC: 4.56 MIL/uL (ref 3.87–5.11)
RDW: 13.9 % (ref 11.5–15.5)
WBC: 9.4 10*3/uL (ref 4.0–10.5)
nRBC: 0 % (ref 0.0–0.2)

## 2023-10-14 LAB — IRON AND TIBC
Iron: 53 ug/dL (ref 28–170)
Saturation Ratios: 16 % (ref 10.4–31.8)
TIBC: 337 ug/dL (ref 250–450)
UIBC: 284 ug/dL

## 2023-10-14 LAB — FERRITIN: Ferritin: 67 ng/mL (ref 11–307)

## 2023-10-14 MED ORDER — CYANOCOBALAMIN 1000 MCG/ML IJ SOLN
1000.0000 ug | INTRAMUSCULAR | Status: DC
  Administered 2023-10-14: 1000 ug via INTRAMUSCULAR
  Filled 2023-10-14: qty 1

## 2023-10-14 NOTE — Progress Notes (Signed)
 Hematology/Oncology Consult Note [facility]  [phone] [phone]  Patient Care Team: Jose-Mathews, Jessnie, MD as PCP - General (Family Medicine)   Name of the patient: Leah Solis  [id number]  1980-10-28   Date of visit: 10/14/23  Diagnosis- history of iron  and B12 deficiency anemia  Chief complaint/ Reason for visit- routine follow-up of anemia  Heme/Onc history:  Patient presented as a 43 year old African-American female with history of iron  and B12 deficiency anemia attributed to gastric bypass surgery in 2015.  She is unable to tolerate oral iron  and has received IV iron  in the past.  She has been on monthly B12 injections.  Patient has also had mild chronic thrombocytosis with a platelet count of his typically fluctuated in the 400s  Interval history- patient is 43 year old female with above history of gastric bypass who returns to clinic for follow up and consideration of iv iron  and b12. She feels well. Has ongoing fatigue. Denies any neurologic complaints. Denies recent fevers or illnesses. Denies any easy bleeding or bruising. No melena or hematochezia. No pica or restless leg. Reports good appetite and denies weight loss. Denies chest pain. Denies any nausea, vomiting, constipation, or diarrhea. Denies urinary complaints. Patient offers no further specific complaints today.   ECOG PS- 0 Pain scale- 0   Review of systems- Review of Systems  Constitutional:  Positive for malaise/fatigue. Negative for chills, fever and weight loss.  HENT:  Negative for congestion, ear discharge and nosebleeds.   Eyes:  Negative for blurred vision.  Respiratory:  Negative for cough, hemoptysis, sputum production, shortness of breath and wheezing.   Cardiovascular:  Negative for chest pain, palpitations, orthopnea and claudication.  Gastrointestinal:  Negative for abdominal pain, blood in stool, constipation, diarrhea, heartburn, melena, nausea  and vomiting.  Genitourinary:  Negative for dysuria, flank pain, frequency, hematuria and urgency.  Musculoskeletal:  Negative for back pain, joint pain and myalgias.  Skin:  Negative for rash.  Neurological:  Negative for dizziness, tingling, focal weakness, seizures, weakness and headaches.  Endo/Heme/Allergies:  Does not bruise/bleed easily.  Psychiatric/Behavioral:  Negative for depression and suicidal ideas. The patient does not have insomnia.      No Known Allergies   Past Medical History:  Diagnosis Date   Anemia    Calcium deficiency     Past Surgical History:  Procedure Laterality Date   BARIATRIC SURGERY     CESAREAN SECTION      Social History   Socioeconomic History   Marital status: Divorced    Spouse name: Not on file   Number of children: Not on file   Years of education: Not on file   Highest education level: Not on file  Occupational History   Not on file  Tobacco Use   Smoking status: Never   Smokeless tobacco: Never  Vaping Use   Vaping status: Never Used  Substance and Sexual Activity   Alcohol use: Yes    Comment: occassionally   Drug use: Never   Sexual activity: Yes  Other Topics Concern   Not on file  Social History Narrative   Not on file   Social Drivers of Health   Financial Resource Strain: Low Risk  (12/27/2022)   Received from [facility], [facility]   Overall Financial Resource Strain (CARDIA)    Difficulty of Paying Living Expenses: Not very hard  Food Insecurity: Unknown (12/27/2022)   Received from [facility], [facility]   Hunger Vital Sign    Worried About Running Out of Food in the Last Year: Never true    Ran Out of Food in the Last Year: Not on file  Transportation Needs: No Transportation Needs (12/27/2022)   Received from [facility], [facility]   [facility] - Transportation    In the past 12 months, has lack  of transportation kept you from medical appointments or from getting medications?: No    Lack of Transportation (Non-Medical): No  Physical Activity: Not on file  Stress: Not on file  Social Connections: Not on file  Intimate Partner Violence: Not on file    Family History  Problem Relation Age of Onset   Diabetes Mother    Thyroid  disease Mother    Breast cancer Neg Hx     Current Outpatient Medications:    cetirizine (ZYRTEC) 10 MG tablet, Take 1 tablet by mouth daily., Disp: , Rfl:  No current facility-administered medications for this visit.  Facility-Administered Medications Ordered in Other Visits:    cyanocobalamin  (VITAMIN B12) injection 1,000 mcg, 1,000 mcg, Intramuscular, Q30 days, Melanee Annah BROCKS, MD  Physical exam:  Vitals:   10/14/23 1327  BP: 120/74  Pulse: 82  Resp: 18  Temp: 99.2 F (37.3 C)  TempSrc: Tympanic  SpO2: 100%  Weight: 197 lb (89.4 kg)    Physical Exam Vitals reviewed.  Constitutional:      Appearance: She is not ill-appearing.  Pulmonary:     Effort: Pulmonary effort is normal.  Abdominal:     General: There is no distension.     Palpations: Abdomen is soft.  Skin:    General: Skin is warm and dry.     Coloration: Skin is not pale.     Findings: No rash.  Neurological:     Mental Status: She is alert and oriented to person, place, and time.  Psychiatric:        Mood and Affect: Mood normal.        Behavior: Behavior normal.       Latest Ref Rng & Units 10/14/2023    1:03 PM  CBC  WBC 4.0 - 10.5 K/uL 9.4   Hemoglobin 12.0 - 15.0 g/dL 87.9   Hematocrit 63.9 - 46.0 % 37.4   Platelets 150 - 400 K/uL 393   Iron /TIBC/Ferritin/ %Sat    Component Value Date/Time   IRON  53 10/14/2023 1303   IRON  12 (L) 07/05/2017 0926   TIBC 337 10/14/2023 1303   TIBC 467 (H) 07/05/2017 0926   FERRITIN 67 10/14/2023 1303   IRONPCTSAT 16 10/14/2023 1303   IRONPCTSAT 3 (LL) 07/05/2017 0926    Assessment and plan- Patient is a 43 y.o. female  with   Iron  Deficiency Anemia- secondary to gastric bypass/malabsorption. Hmg 12.0 today. Ferritin and iron  studies pending. Hold venofer . Ferritin 67, iron  sat 16%. She does not require iv iron  for now. Could trial sublingual barimelts in the future. Last received venofer  08/02/23.  B12 deficiency- due to malabsorption. B12 was 231 in July 2024. Receives b12 monthly. Proceed with b12 today.  Thrombocytosis- no history of thrombosis. Thrombocytosis was present even when iron  levels were normal. BCR-ABL FISH was negative. JAK2 was not performed so I'll order for her next visit. Platelet count is normal today. Unlikely a myeloproliferative disorder given stability of her counts.   Disposition:  B12 today and monthly Hold Venofer  6 mo- lab (cbc, ferritin, iron  studies, b12, jak2), Dr Melanee, +/-  venofer  & b12- la   Visit Diagnosis 1. Iron  deficiency anemia following bariatric surgery   2. Thrombocytosis   3. B12 deficiency     Tinnie Dawn, DNP, AGNP-C, [facility] at [facility] [phone] (clinic) 10/14/2023

## 2023-10-15 ENCOUNTER — Encounter: Payer: Self-pay | Admitting: Hematology and Oncology

## 2023-11-14 ENCOUNTER — Inpatient Hospital Stay: Payer: 59 | Attending: Oncology

## 2023-11-14 DIAGNOSIS — K9589 Other complications of other bariatric procedure: Secondary | ICD-10-CM

## 2023-11-14 DIAGNOSIS — D519 Vitamin B12 deficiency anemia, unspecified: Secondary | ICD-10-CM | POA: Insufficient documentation

## 2023-11-14 MED ORDER — CYANOCOBALAMIN 1000 MCG/ML IJ SOLN
1000.0000 ug | INTRAMUSCULAR | Status: DC
  Administered 2023-11-14: 1000 ug via INTRAMUSCULAR
  Filled 2023-11-14: qty 1

## 2023-12-10 ENCOUNTER — Inpatient Hospital Stay: Attending: Oncology

## 2023-12-10 DIAGNOSIS — D519 Vitamin B12 deficiency anemia, unspecified: Secondary | ICD-10-CM | POA: Diagnosis present

## 2023-12-10 DIAGNOSIS — D508 Other iron deficiency anemias: Secondary | ICD-10-CM

## 2023-12-10 MED ORDER — CYANOCOBALAMIN 1000 MCG/ML IJ SOLN
1000.0000 ug | INTRAMUSCULAR | Status: DC
  Administered 2023-12-10: 1000 ug via INTRAMUSCULAR
  Filled 2023-12-10: qty 1

## 2023-12-12 ENCOUNTER — Inpatient Hospital Stay: Payer: 59

## 2024-01-13 ENCOUNTER — Inpatient Hospital Stay: Payer: 59

## 2024-01-14 ENCOUNTER — Encounter: Payer: Self-pay | Admitting: Hematology and Oncology

## 2024-01-14 ENCOUNTER — Inpatient Hospital Stay: Attending: Oncology

## 2024-01-14 DIAGNOSIS — D519 Vitamin B12 deficiency anemia, unspecified: Secondary | ICD-10-CM | POA: Diagnosis present

## 2024-01-14 DIAGNOSIS — D508 Other iron deficiency anemias: Secondary | ICD-10-CM

## 2024-01-14 MED ORDER — CYANOCOBALAMIN 1000 MCG/ML IJ SOLN
1000.0000 ug | INTRAMUSCULAR | Status: DC
  Administered 2024-01-14: 1000 ug via INTRAMUSCULAR
  Filled 2024-01-14: qty 1

## 2024-02-12 ENCOUNTER — Inpatient Hospital Stay: Payer: 59 | Attending: Oncology

## 2024-02-12 DIAGNOSIS — D519 Vitamin B12 deficiency anemia, unspecified: Secondary | ICD-10-CM | POA: Diagnosis present

## 2024-02-12 DIAGNOSIS — K9589 Other complications of other bariatric procedure: Secondary | ICD-10-CM

## 2024-02-12 MED ORDER — CYANOCOBALAMIN 1000 MCG/ML IJ SOLN
1000.0000 ug | INTRAMUSCULAR | Status: DC
  Administered 2024-02-12: 1000 ug via INTRAMUSCULAR
  Filled 2024-02-12: qty 1

## 2024-03-13 ENCOUNTER — Inpatient Hospital Stay: Payer: 59 | Attending: Oncology

## 2024-03-13 DIAGNOSIS — D519 Vitamin B12 deficiency anemia, unspecified: Secondary | ICD-10-CM | POA: Diagnosis present

## 2024-03-13 DIAGNOSIS — K9589 Other complications of other bariatric procedure: Secondary | ICD-10-CM

## 2024-03-13 MED ORDER — CYANOCOBALAMIN 1000 MCG/ML IJ SOLN
1000.0000 ug | INTRAMUSCULAR | Status: DC
  Administered 2024-03-13: 1000 ug via INTRAMUSCULAR
  Filled 2024-03-13: qty 1

## 2024-04-03 ENCOUNTER — Inpatient Hospital Stay

## 2024-04-03 DIAGNOSIS — D75839 Thrombocytosis, unspecified: Secondary | ICD-10-CM

## 2024-04-03 DIAGNOSIS — D519 Vitamin B12 deficiency anemia, unspecified: Secondary | ICD-10-CM | POA: Diagnosis not present

## 2024-04-03 DIAGNOSIS — E538 Deficiency of other specified B group vitamins: Secondary | ICD-10-CM

## 2024-04-03 DIAGNOSIS — K9589 Other complications of other bariatric procedure: Secondary | ICD-10-CM

## 2024-04-03 LAB — CBC WITH DIFFERENTIAL/PLATELET
Abs Immature Granulocytes: 0.04 10*3/uL (ref 0.00–0.07)
Basophils Absolute: 0.1 10*3/uL (ref 0.0–0.1)
Basophils Relative: 1 %
Eosinophils Absolute: 0.3 10*3/uL (ref 0.0–0.5)
Eosinophils Relative: 3 %
HCT: 34.6 % — ABNORMAL LOW (ref 36.0–46.0)
Hemoglobin: 10.8 g/dL — ABNORMAL LOW (ref 12.0–15.0)
Immature Granulocytes: 0 %
Lymphocytes Relative: 30 %
Lymphs Abs: 2.8 10*3/uL (ref 0.7–4.0)
MCH: 25.2 pg — ABNORMAL LOW (ref 26.0–34.0)
MCHC: 31.2 g/dL (ref 30.0–36.0)
MCV: 80.8 fL (ref 80.0–100.0)
Monocytes Absolute: 0.8 10*3/uL (ref 0.1–1.0)
Monocytes Relative: 9 %
Neutro Abs: 5.4 10*3/uL (ref 1.7–7.7)
Neutrophils Relative %: 57 %
Platelets: 420 10*3/uL — ABNORMAL HIGH (ref 150–400)
RBC: 4.28 MIL/uL (ref 3.87–5.11)
RDW: 13.4 % (ref 11.5–15.5)
WBC: 9.3 10*3/uL (ref 4.0–10.5)
nRBC: 0 % (ref 0.0–0.2)

## 2024-04-03 LAB — VITAMIN B12: Vitamin B-12: 471 pg/mL (ref 180–914)

## 2024-04-03 LAB — IRON AND TIBC
Iron: 31 ug/dL (ref 28–170)
Saturation Ratios: 7 % — ABNORMAL LOW (ref 10.4–31.8)
TIBC: 424 ug/dL (ref 250–450)
UIBC: 393 ug/dL

## 2024-04-03 LAB — FERRITIN: Ferritin: 6 ng/mL — ABNORMAL LOW (ref 11–307)

## 2024-04-04 ENCOUNTER — Emergency Department

## 2024-04-04 ENCOUNTER — Other Ambulatory Visit: Payer: Self-pay

## 2024-04-04 ENCOUNTER — Emergency Department
Admission: EM | Admit: 2024-04-04 | Discharge: 2024-04-04 | Disposition: A | Discharge status: Home / Self Care | Attending: Emergency Medicine | Admitting: Emergency Medicine

## 2024-04-04 ENCOUNTER — Telehealth: Payer: Self-pay | Admitting: Oncology

## 2024-04-04 DIAGNOSIS — R531 Weakness: Secondary | ICD-10-CM | POA: Diagnosis not present

## 2024-04-04 DIAGNOSIS — R0602 Shortness of breath: Secondary | ICD-10-CM | POA: Diagnosis not present

## 2024-04-04 DIAGNOSIS — R5383 Other fatigue: Secondary | ICD-10-CM | POA: Insufficient documentation

## 2024-04-04 LAB — CBC
HCT: 36.1 % (ref 36.0–46.0)
Hemoglobin: 11.3 g/dL — ABNORMAL LOW (ref 12.0–15.0)
MCH: 25.3 pg — ABNORMAL LOW (ref 26.0–34.0)
MCHC: 31.3 g/dL (ref 30.0–36.0)
MCV: 80.9 fL (ref 80.0–100.0)
Platelets: 448 K/uL — ABNORMAL HIGH (ref 150–400)
RBC: 4.46 MIL/uL (ref 3.87–5.11)
RDW: 13.4 % (ref 11.5–15.5)
WBC: 8.4 K/uL (ref 4.0–10.5)
nRBC: 0 % (ref 0.0–0.2)

## 2024-04-04 LAB — POC URINE PREG, ED: Preg Test, Ur: NEGATIVE

## 2024-04-04 LAB — COMPREHENSIVE METABOLIC PANEL WITH GFR
ALT: 12 U/L (ref 0–44)
AST: 17 U/L (ref 15–41)
Albumin: 3.8 g/dL (ref 3.5–5.0)
Alkaline Phosphatase: 128 U/L — ABNORMAL HIGH (ref 38–126)
Anion gap: 7 (ref 5–15)
BUN: 13 mg/dL (ref 6–20)
CO2: 22 mmol/L (ref 22–32)
Calcium: 8.9 mg/dL (ref 8.9–10.3)
Chloride: 107 mmol/L (ref 98–111)
Creatinine, Ser: 0.61 mg/dL (ref 0.44–1.00)
GFR, Estimated: >60 mL/min
Glucose, Bld: 103 mg/dL — ABNORMAL HIGH (ref 70–99)
Potassium: 3.6 mmol/L (ref 3.5–5.1)
Sodium: 136 mmol/L (ref 135–145)
Total Bilirubin: 0.3 mg/dL (ref 0.0–1.2)
Total Protein: 7.6 g/dL (ref 6.5–8.1)

## 2024-04-04 LAB — TSH: TSH: 1.207 u[IU]/mL (ref 0.350–4.500)

## 2024-04-04 LAB — TROPONIN I (HIGH SENSITIVITY): Troponin I (High Sensitivity): 4 ng/L (ref <18)

## 2024-04-04 LAB — T4, FREE: Free T4: 0.68 ng/dL (ref 0.61–1.12)

## 2024-04-04 MED ORDER — LACTATED RINGERS IV BOLUS
1000.0000 mL | Freq: Once | INTRAVENOUS | Status: AC
  Administered 2024-04-04: 1000 mL via INTRAVENOUS

## 2024-04-04 NOTE — ED Triage Notes (Signed)
 Pt here stating that her provider told her that if she feels bad today to come to the ED, pt has hx of low iron  and needs transfusions often and states she felt worse today. Pt denies NVD but endorses constipation and fatigue.

## 2024-04-04 NOTE — Telephone Encounter (Signed)
 Patient called to report that she had work up done yesterday. She feels very lightheaded, short of breath. No bleeding events. Her blood work results are consistent with iron  deficiency and she is scheduled for IV iron  next week. Patient feels that her symptoms are much worse than yesterday and she can not wait till next week for iron  infusion. Recommend her to go to ER for further evaluation.

## 2024-04-04 NOTE — ED Provider Notes (Signed)
 [facility] Provider Note    Event Date/Time   First MD Initiated Contact with Patient 04/04/24 1126     (approximate)   History   Abnormal Labs   HPI  Leah Solis is a 43 y.o. female with history of iron  deficiency anemia, presenting with lightheadedness, fatigue.  States been ongoing for the last couple weeks, also with some shortness of breath.  Does state that she has heavy periods, last 1 was 2 to 3 weeks ago.  No abdominal pain or chest pain, no recent travel or surgeries, no history of cardiac issues, no history of blood clots or unilateral calf swelling or tenderness.  Does not take any hormones.  No other infectious symptoms.  States that she has an infusion set up for Tuesday.  On event chart review, she had called her oncologist who sent her to the emergency department.  She did not report any bleeding events.     Physical Exam   Triage Vital Signs: ED Triage Vitals  Encounter Vitals Group     BP 04/04/24 1016 135/85     Girls Systolic BP Percentile --      Girls Diastolic BP Percentile --      Boys Systolic BP Percentile --      Boys Diastolic BP Percentile --      Pulse Rate 04/04/24 1014 95     Resp 04/04/24 1014 18     Temp 04/04/24 1014 98.4 F (36.9 C)     Temp Source 04/04/24 1014 Oral     SpO2 04/04/24 1016 100 %     Weight 04/04/24 1017 197 lb 1.5 oz (89.4 kg)     Height 04/04/24 1017 5' 4 (1.626 m)     Head Circumference --      Peak Flow --      Pain Score 04/04/24 1017 0     Pain Loc --      Pain Education --      Exclude from Growth Chart --     Most recent vital signs: Vitals:   04/04/24 1016 04/04/24 1155  BP: 135/85 117/87  Pulse:    Resp:    Temp:    SpO2: 100%      General: Awake, no distress.  CV:  Good peripheral perfusion.  Resp:  Normal effort.  No tachypnea or respiratory distress Abd:  No distention.  Soft nontender Other:  No lower extremity edema, no unilateral calf swelling or  tenderness   ED Results / Procedures / Treatments   Labs (all labs ordered are listed, but only abnormal results are displayed) Labs Reviewed  CBC - Abnormal; Notable for the following components:      Result Value   Hemoglobin 11.3 (*)    MCH 25.3 (*)    Platelets 448 (*)    All other components within normal limits  COMPREHENSIVE METABOLIC PANEL WITH GFR - Abnormal; Notable for the following components:   Glucose, Bld 103 (*)    Alkaline Phosphatase 128 (*)    All other components within normal limits  TSH  T4, FREE  POC URINE PREG, ED  TROPONIN I (HIGH SENSITIVITY)     EKG  EKG shows, sinus rhythm heart rate 74, normal QRS, normal QTc, no ischemic ST elevation, T wave flattening V3, no prior to compare   RADIOLOGY On my independent interpretation, chest x-ray without obvious consolidation   PROCEDURES:  Critical Care performed: No  Procedures   MEDICATIONS ORDERED  IN ED: Medications  lactated ringers bolus 1,000 mL (1,000 mLs Intravenous Bolus from Bag 04/04/24 1146)     IMPRESSION / MDM / ASSESSMENT AND PLAN / ED COURSE  I reviewed the triage vital signs and the nursing notes.                              Differential diagnosis includes, but is not limited to, electrolyte derangements, dehydration, anemia, atypical ACS, consider PE but patient has no other risk factors for it, she is PERC 0, also considered thyroid  dysfunction.  Will get labs, EKG, IV fluids.  Patient's presentation is most consistent with acute presentation with potential threat to life or bodily function.  Independent interpretation of labs and imaging below.  Labs are reassuring, no severe electrolyte derangements, hemoglobin is stable.  On reassessment patient is feeling a lot better after the fluids.  Shared decision making done with patient and she is agreeable plan for discharge, considered but no indication for inpatient admission at this time, she safe for outpatient management.    Instructed her to keep self hydrated.  She is agreeable with this plan.  Discharged with strict return precautions.  The patient is on the cardiac monitor to evaluate for evidence of arrhythmia and/or significant heart rate changes.   Clinical Course as of 04/04/24 1322  Sat Apr 04, 2024  1224 Independent review of labs, pregnancy test is negative, electrolytes not severely deranged, no leukocytosis, hemoglobin is low but is uptrending compared to prior, alk phos is mildly elevated, troponin is negative, TSH and free T4 are normal. [TT]  1300 DG Chest 2 View No acute cardiopulmonary abnormality.  [TT]    Clinical Course User Index [TT] Waymond, Lorelle Cummins, MD     FINAL CLINICAL IMPRESSION(S) / ED DIAGNOSES   Final diagnoses:  Weakness  Shortness of breath  Fatigue, unspecified type     Rx / DC Orders   ED Discharge Orders     None        Note:  This document was prepared using Dragon voice recognition software and may include unintentional dictation errors.    Waymond Lorelle Cummins, MD 04/04/24 [phone]

## 2024-04-04 NOTE — Discharge Instructions (Signed)
 Please make sure to keep yourself hydrated.  Please make sure to follow-up with your oncologist and go to your transfusion appointment on Tuesday.

## 2024-04-07 ENCOUNTER — Encounter: Payer: Self-pay | Admitting: Oncology

## 2024-04-07 ENCOUNTER — Inpatient Hospital Stay

## 2024-04-07 ENCOUNTER — Other Ambulatory Visit: Payer: Self-pay

## 2024-04-07 ENCOUNTER — Telehealth: Payer: Self-pay

## 2024-04-07 ENCOUNTER — Inpatient Hospital Stay: Attending: Oncology | Admitting: Oncology

## 2024-04-07 VITALS — BP 120/84 | HR 86 | Resp 18

## 2024-04-07 VITALS — BP 129/83 | HR 97 | Temp 97.9°F | Resp 18 | Ht 64.0 in | Wt 199.0 lb

## 2024-04-07 DIAGNOSIS — K9589 Other complications of other bariatric procedure: Secondary | ICD-10-CM | POA: Diagnosis not present

## 2024-04-07 DIAGNOSIS — D508 Other iron deficiency anemias: Secondary | ICD-10-CM

## 2024-04-07 DIAGNOSIS — D519 Vitamin B12 deficiency anemia, unspecified: Secondary | ICD-10-CM | POA: Insufficient documentation

## 2024-04-07 DIAGNOSIS — D509 Iron deficiency anemia, unspecified: Secondary | ICD-10-CM | POA: Diagnosis present

## 2024-04-07 DIAGNOSIS — Z9884 Bariatric surgery status: Secondary | ICD-10-CM | POA: Diagnosis not present

## 2024-04-07 MED ORDER — IRON SUCROSE 20 MG/ML IV SOLN
200.0000 mg | INTRAVENOUS | Status: DC
  Administered 2024-04-07: 200 mg via INTRAVENOUS

## 2024-04-07 MED ORDER — SODIUM CHLORIDE 0.9% FLUSH
10.0000 mL | Freq: Once | INTRAVENOUS | Status: AC | PRN
  Administered 2024-04-07: 10 mL
  Filled 2024-04-07: qty 10

## 2024-04-07 NOTE — Telephone Encounter (Signed)
 Referral faxed to [facility] for ongoing iron  deficiency which could be secondary to her gastric bypass surgery but she has not had any GI evaluation at all and she is nearing 43 years of age. Fax confirmation received; will leave note open to follow up.

## 2024-04-07 NOTE — Progress Notes (Signed)
 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  She is unable to tolerate oral iron  and has received IV iron  in the past.  She has been on monthly B12 injections.  Patient has also had mild chronic thrombocytosis with a platelet count of his typically fluctuated in the 400s   Interval history-patient was in the ER 2 days ago for symptoms of worsening fatigue.  She was found to be dehydrated and received IV fluids.  Currently patient continues to report fatigue.  Denies any blood loss in her stool or urine.  Denies any dark melanotic stools.  Menstrual cycles are regular and she does not report them to be particularly heavy.  ECOG PS- 1 Pain scale- 0   Review of systems- Review of Systems  Constitutional:  Negative for chills, fever, malaise/fatigue and weight loss.  HENT:  Negative for congestion, ear discharge and nosebleeds.   Eyes:  Negative for blurred vision.  Respiratory:  Negative for cough, hemoptysis, sputum production, shortness of breath and wheezing.   Cardiovascular:  Negative for chest pain, palpitations, orthopnea and claudication.  Gastrointestinal:  Negative for abdominal pain, blood in stool, constipation, diarrhea, heartburn, melena, nausea and vomiting.  Genitourinary:  Negative for dysuria, flank pain, frequency, hematuria and urgency.  Musculoskeletal:  Negative for back pain, joint pain and myalgias.   Skin:  Negative for rash.  Neurological:  Negative for dizziness, tingling, focal weakness, seizures, weakness and headaches.  Endo/Heme/Allergies:  Does not bruise/bleed easily.  Psychiatric/Behavioral:  Negative for depression and suicidal ideas. The patient does not have insomnia.       No Known Allergies   Past Medical History:  Diagnosis Date   Anemia    Calcium deficiency      Past Surgical History:  Procedure Laterality Date   BARIATRIC SURGERY     CESAREAN SECTION      Social History   Socioeconomic History   Marital status: Divorced    Spouse name: Not on file   Number of children: Not on file   Years of education: Not on file   Highest education level: Not on file  Occupational History   Not on file  Tobacco Use   Smoking status: Never   Smokeless tobacco: Never  Vaping Use   Vaping status: Never Used  Substance and Sexual Activity   Alcohol use: Yes    Comment: occassionally   Drug use: Never   Sexual activity: Yes  Other Topics Concern   Not on file  Social History Narrative   Not on file   Social Drivers of Health   Financial Resource Strain: High Risk (12/27/2023)   Received from [facility]   Overall Financial Resource Strain (CARDIA)    Difficulty of Paying Living Expenses: Very hard  Food Insecurity: Food Insecurity Present (12/27/2023)   Received from [facility]   Hunger Vital Sign    Within the past 12 months, you worried that your food would run out before you got  the money to buy more.: Sometimes true    Within the past 12 months, the food you bought just didn't last and you didn't have money to get more.: Sometimes true  Transportation Needs: Unmet Transportation Needs (12/27/2023)   Received from [facility] - Transportation    In the past 12 months, has lack of transportation kept you from medical appointments or from getting medications?: Yes    Lack of  Transportation (Non-Medical): Yes  Physical Activity: Not on file  Stress: Not on file  Social Connections: Not on file  Intimate Partner Violence: Not on file    Family History  Problem Relation Age of Onset   Diabetes Mother    Thyroid  disease Mother    Breast cancer Neg Hx     No current outpatient medications on file. No current facility-administered medications for this visit.  Facility-Administered Medications Ordered in Other Visits:    iron  sucrose (VENOFER ) injection 200 mg, 200 mg, Intravenous, Weekly, Kevonta Phariss C, MD, 200 mg at 04/07/24 1000  Physical exam:  Vitals:   04/07/24 0923 04/07/24 0928  BP:  129/83  Pulse:  97  Resp:  18  Temp:  97.9 F (36.6 C)  TempSrc:  Tympanic  Weight: 199 lb (90.3 kg)   Height: 5' 4 (1.626 m)    Physical Exam  Cardiovascular:     Rate and Rhythm: Normal rate and regular rhythm.     Heart sounds: Normal heart sounds.  Pulmonary:     Effort: Pulmonary effort is normal.     Breath sounds: Normal breath sounds.  Abdominal:     General: Bowel sounds are normal.   Skin:    General: Skin is warm and dry.   Neurological:     Mental Status: She is alert and oriented to person, place, and time.      I have personally reviewed labs listed below:    Latest Ref Rng & Units 04/04/2024   10:21 AM  CMP  Glucose 70 - 99 mg/dL 896   BUN 6 - 20 mg/dL 13   Creatinine 9.55 - 1.00 mg/dL 9.38   Sodium 864 - 854 mmol/L 136   Potassium 3.5 - 5.1 mmol/L 3.6   Chloride 98 - 111 mmol/L 107   CO2 22 - 32 mmol/L 22   Calcium 8.9 - 10.3 mg/dL 8.9   Total Protein 6.5 - 8.1 g/dL 7.6   Total Bilirubin 0.0 - 1.2 mg/dL 0.3   Alkaline Phos 38 - 126 U/L 128   AST 15 - 41 U/L 17   ALT 0 - 44 U/L 12       Latest Ref Rng & Units 04/04/2024   10:21 AM  CBC  WBC 4.0 - 10.5 K/uL 8.4   Hemoglobin 12.0 - 15.0 g/dL 88.6   Hematocrit 63.9 - 46.0 % 36.1   Platelets 150 - 400 K/uL 448    I have personally reviewed Radiology images listed  below: No images are attached to the encounter.  DG Chest 2 View Result Date: 04/04/2024 CLINICAL DATA:  sob EXAM: CHEST - 2 VIEW COMPARISON:  None available. FINDINGS: No focal airspace consolidation, pleural effusion, or pneumothorax. No cardiomegaly.No acute fracture or destructive lesion. IMPRESSION: No acute cardiopulmonary abnormality. Electronically Signed   By: Rogelia Myers M.D.   On: 04/04/2024 12:38     Assessment and plan- Patient is a 43 y.o. female here for routine follow-up of iron  deficiency anemia  Patient is mildly  anemic with a hemoglobin of 11.3 based on labs on 04/04/2024.  Ferritin levels are presently low at 6 with iron  saturation of 7%.  We will proceed with 5 doses of Venofer  at this time.  Discussed recent benefits of IV iron  including all but not limited to possible risk of infusion and anaphylactic reaction.  Patient understands and agrees to proceed as planned.  B12 levels are presently normal at 471 and she does not require any B12 injections.  She can continue with oral B12 1000 mcg daily.  CBC ferritin and iron  studies in 2 4 and 6 months and I will see her back in 6 months.  We will also refer the patient to [facility] given her ongoing iron  deficiency which could be secondary to her gastric bypass surgery but she has not had any GI evaluation at all and she is nearing 43 years of age.   Visit Diagnosis 1. Iron  deficiency anemia following bariatric surgery      Dr. Annah Skene, MD, MPH [facility] [phone] 04/07/2024 12:18 PM

## 2024-04-13 ENCOUNTER — Other Ambulatory Visit: Payer: 59

## 2024-04-13 ENCOUNTER — Ambulatory Visit: Payer: 59

## 2024-04-13 ENCOUNTER — Ambulatory Visit: Payer: 59 | Admitting: Oncology

## 2024-04-13 LAB — CALR +MPL + E12-E15  (REFLEX)

## 2024-04-13 LAB — JAK2 V617F RFX CALR/MPL/E12-15

## 2024-04-14 ENCOUNTER — Inpatient Hospital Stay

## 2024-04-14 VITALS — BP 125/77 | HR 87 | Temp 98.4°F | Resp 16

## 2024-04-14 DIAGNOSIS — K9589 Other complications of other bariatric procedure: Secondary | ICD-10-CM

## 2024-04-14 DIAGNOSIS — D509 Iron deficiency anemia, unspecified: Secondary | ICD-10-CM | POA: Diagnosis not present

## 2024-04-14 MED ORDER — IRON SUCROSE 20 MG/ML IV SOLN
200.0000 mg | INTRAVENOUS | Status: DC
  Administered 2024-04-14: 200 mg via INTRAVENOUS

## 2024-04-14 MED FILL — Lactated Ringer's Solution: INTRAVENOUS | Qty: 1000 | Status: AC

## 2024-04-14 NOTE — Progress Notes (Signed)
 Declined post-observation. Aware of risks. Vitals stable at discharge.

## 2024-04-14 NOTE — Patient Instructions (Signed)

## 2024-04-21 ENCOUNTER — Inpatient Hospital Stay

## 2024-04-21 VITALS — BP 117/66 | HR 99 | Resp 16

## 2024-04-21 DIAGNOSIS — D509 Iron deficiency anemia, unspecified: Secondary | ICD-10-CM | POA: Diagnosis not present

## 2024-04-21 DIAGNOSIS — D508 Other iron deficiency anemias: Secondary | ICD-10-CM

## 2024-04-21 MED ORDER — IRON SUCROSE 20 MG/ML IV SOLN
200.0000 mg | INTRAVENOUS | Status: DC
  Administered 2024-04-21: 200 mg via INTRAVENOUS
  Filled 2024-04-21: qty 10

## 2024-04-23 ENCOUNTER — Encounter: Payer: Self-pay | Admitting: Hematology and Oncology

## 2024-04-23 NOTE — Telephone Encounter (Signed)
 Sheena followed up on referral; patient is scheduled for 10/20/2024 with Dr. Onita and is on the cancellation list if can be scheduled sooner.

## 2024-04-28 ENCOUNTER — Inpatient Hospital Stay

## 2024-04-28 VITALS — BP 133/77 | HR 86 | Temp 97.0°F | Resp 17

## 2024-04-28 DIAGNOSIS — D509 Iron deficiency anemia, unspecified: Secondary | ICD-10-CM | POA: Diagnosis not present

## 2024-04-28 DIAGNOSIS — D508 Other iron deficiency anemias: Secondary | ICD-10-CM

## 2024-04-28 MED ORDER — SODIUM CHLORIDE 0.9% FLUSH
10.0000 mL | Freq: Once | INTRAVENOUS | Status: AC | PRN
Start: 2024-04-28 — End: 2024-04-28
  Administered 2024-04-28: 10 mL
  Filled 2024-04-28: qty 10

## 2024-04-28 MED ORDER — IRON SUCROSE 20 MG/ML IV SOLN
200.0000 mg | INTRAVENOUS | Status: DC
  Administered 2024-04-28: 200 mg via INTRAVENOUS
  Filled 2024-04-28: qty 10

## 2024-04-28 NOTE — Progress Notes (Signed)
Patient tolerated Venofer infusion well. Explained recommendation of 30 min post monitoring. Patient refused to wait post monitoring. Educated on what signs to watch for & to call with any concerns. No questions, discharged. Stable  

## 2024-04-28 NOTE — Patient Instructions (Signed)

## 2024-05-05 ENCOUNTER — Inpatient Hospital Stay

## 2024-05-05 VITALS — BP 125/62 | HR 89 | Temp 96.3°F | Resp 18

## 2024-05-05 DIAGNOSIS — D509 Iron deficiency anemia, unspecified: Secondary | ICD-10-CM | POA: Diagnosis not present

## 2024-05-05 DIAGNOSIS — D508 Other iron deficiency anemias: Secondary | ICD-10-CM

## 2024-05-05 MED ORDER — IRON SUCROSE 20 MG/ML IV SOLN
200.0000 mg | INTRAVENOUS | Status: DC
  Administered 2024-05-05: 200 mg via INTRAVENOUS
  Filled 2024-05-05: qty 10

## 2024-05-05 NOTE — Patient Instructions (Signed)

## 2024-06-09 ENCOUNTER — Inpatient Hospital Stay

## 2024-06-16 ENCOUNTER — Inpatient Hospital Stay: Attending: Oncology

## 2024-06-16 DIAGNOSIS — D509 Iron deficiency anemia, unspecified: Secondary | ICD-10-CM | POA: Insufficient documentation

## 2024-06-22 ENCOUNTER — Ambulatory Visit: Payer: Self-pay | Admitting: Oncology

## 2024-06-22 ENCOUNTER — Inpatient Hospital Stay

## 2024-06-22 DIAGNOSIS — D508 Other iron deficiency anemias: Secondary | ICD-10-CM

## 2024-06-22 DIAGNOSIS — D509 Iron deficiency anemia, unspecified: Secondary | ICD-10-CM | POA: Diagnosis present

## 2024-06-22 LAB — CBC (CANCER CENTER ONLY)
HCT: 38.4 % (ref 36.0–46.0)
Hemoglobin: 11.9 g/dL — ABNORMAL LOW (ref 12.0–15.0)
MCH: 26 pg (ref 26.0–34.0)
MCHC: 31 g/dL (ref 30.0–36.0)
MCV: 84 fL (ref 80.0–100.0)
Platelet Count: 481 K/uL — ABNORMAL HIGH (ref 150–400)
RBC: 4.57 MIL/uL (ref 3.87–5.11)
RDW: 14.2 % (ref 11.5–15.5)
WBC Count: 8 K/uL (ref 4.0–10.5)
nRBC: 0 % (ref 0.0–0.2)

## 2024-06-22 LAB — IRON AND TIBC
Iron: 88 ug/dL (ref 28–170)
Saturation Ratios: 30 % (ref 10.4–31.8)
TIBC: 295 ug/dL (ref 250–450)
UIBC: 207 ug/dL

## 2024-06-22 LAB — FERRITIN: Ferritin: 88 ng/mL (ref 11–307)

## 2024-08-06 ENCOUNTER — Inpatient Hospital Stay: Attending: Oncology

## 2024-08-06 DIAGNOSIS — Z9884 Bariatric surgery status: Secondary | ICD-10-CM | POA: Insufficient documentation

## 2024-08-06 DIAGNOSIS — D509 Iron deficiency anemia, unspecified: Secondary | ICD-10-CM | POA: Insufficient documentation

## 2024-08-06 DIAGNOSIS — D508 Other iron deficiency anemias: Secondary | ICD-10-CM

## 2024-08-06 LAB — CBC (CANCER CENTER ONLY)
HCT: 35.8 % — ABNORMAL LOW (ref 36.0–46.0)
Hemoglobin: 11.7 g/dL — ABNORMAL LOW (ref 12.0–15.0)
MCH: 26.8 pg (ref 26.0–34.0)
MCHC: 32.7 g/dL (ref 30.0–36.0)
MCV: 81.9 fL (ref 80.0–100.0)
Platelet Count: 400 K/uL (ref 150–400)
RBC: 4.37 MIL/uL (ref 3.87–5.11)
RDW: 13.3 % (ref 11.5–15.5)
WBC Count: 7.7 K/uL (ref 4.0–10.5)
nRBC: 0 % (ref 0.0–0.2)

## 2024-08-06 LAB — IRON AND TIBC
Iron: 83 ug/dL (ref 28–170)
Saturation Ratios: 27 % (ref 10.4–31.8)
TIBC: 305 ug/dL (ref 250–450)
UIBC: 222 ug/dL

## 2024-08-06 LAB — FERRITIN: Ferritin: 102 ng/mL (ref 11–307)

## 2024-08-07 ENCOUNTER — Other Ambulatory Visit

## 2024-09-16 ENCOUNTER — Telehealth: Payer: Self-pay

## 2024-09-16 NOTE — Telephone Encounter (Signed)
 FMLA request received from [employer].  Will follow up with patient to inquire what she is requesting for FMLA.  It looks as though she is on a 6 month follow up with next visit 10/07/2024

## 2024-10-07 ENCOUNTER — Inpatient Hospital Stay

## 2024-10-07 ENCOUNTER — Inpatient Hospital Stay: Attending: Oncology

## 2024-10-07 ENCOUNTER — Other Ambulatory Visit: Payer: Self-pay

## 2024-10-07 ENCOUNTER — Inpatient Hospital Stay (HOSPITAL_BASED_OUTPATIENT_CLINIC_OR_DEPARTMENT_OTHER): Admitting: Nurse Practitioner

## 2024-10-07 ENCOUNTER — Encounter: Payer: Self-pay | Admitting: Nurse Practitioner

## 2024-10-07 VITALS — BP 128/85 | HR 78 | Temp 97.9°F | Resp 18 | Wt 197.0 lb

## 2024-10-07 DIAGNOSIS — E538 Deficiency of other specified B group vitamins: Secondary | ICD-10-CM | POA: Diagnosis not present

## 2024-10-07 DIAGNOSIS — D508 Other iron deficiency anemias: Secondary | ICD-10-CM

## 2024-10-07 DIAGNOSIS — D519 Vitamin B12 deficiency anemia, unspecified: Secondary | ICD-10-CM | POA: Insufficient documentation

## 2024-10-07 LAB — IRON AND TIBC
Iron: 69 ug/dL (ref 28–170)
Saturation Ratios: 22 % (ref 10.4–31.8)
TIBC: 311 ug/dL (ref 250–450)
UIBC: 242 ug/dL

## 2024-10-07 LAB — CBC (CANCER CENTER ONLY)
HCT: 37.3 % (ref 36.0–46.0)
Hemoglobin: 12 g/dL (ref 12.0–15.0)
MCH: 26.5 pg (ref 26.0–34.0)
MCHC: 32.2 g/dL (ref 30.0–36.0)
MCV: 82.5 fL (ref 80.0–100.0)
Platelet Count: 474 K/uL — ABNORMAL HIGH (ref 150–400)
RBC: 4.52 MIL/uL (ref 3.87–5.11)
RDW: 13.2 % (ref 11.5–15.5)
WBC Count: 7.6 K/uL (ref 4.0–10.5)
nRBC: 0 % (ref 0.0–0.2)

## 2024-10-07 LAB — VITAMIN B12: Vitamin B-12: >4000 pg/mL — ABNORMAL HIGH (ref 180–914)

## 2024-10-07 LAB — FERRITIN: Ferritin: 99 ng/mL (ref 11–307)

## 2024-10-07 MED ORDER — CYANOCOBALAMIN 1000 MCG/ML IJ SOLN
1000.0000 ug | INTRAMUSCULAR | Status: DC
  Administered 2024-10-07: 1000 ug via INTRAMUSCULAR
  Filled 2024-10-07: qty 1

## 2024-10-07 NOTE — Progress Notes (Unsigned)
 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  She is unable to tolerate oral iron  and has received IV iron  in the past.  She has been on monthly B12 injections.  Patient has also had mild chronic thrombocytosis with a platelet count of his typically fluctuated in the 400s   Interval history-  Patient overall feeling well today.  She does report some chronic fatigue.  She reports that she is not been taking vitamin b12 in quite a long time.  Looks like her last vitamin b12 level was in June.  Will give her a vitamin b12 injection today. Instructed her to start back on liquid or sublingual b12 today.  I also added vitamin b12 level to her labs as well.  ECOG PS- 1 Pain scale- 0   Review of systems- Review of Systems  Constitutional:  Positive for malaise/fatigue. Negative for chills, fever and weight loss.  HENT:  Negative for congestion, ear discharge and nosebleeds.   Eyes:  Negative for blurred vision.  Respiratory:  Negative for cough, hemoptysis, sputum production, shortness of breath and wheezing.   Cardiovascular:  Negative for chest pain, palpitations, orthopnea and claudication.  Gastrointestinal:  Negative for abdominal pain, blood in stool, constipation, diarrhea, heartburn, melena, nausea and vomiting.  Genitourinary:  Negative for dysuria, flank pain, frequency, hematuria and urgency.  Musculoskeletal:   Negative for back pain, joint pain and myalgias.  Skin:  Negative for rash.  Neurological:  Negative for dizziness, tingling, focal weakness, seizures, weakness and headaches.  Endo/Heme/Allergies:  Does not bruise/bleed easily.  Psychiatric/Behavioral:  Negative for depression and suicidal ideas. The patient does not have insomnia.       No Known Allergies   Past Medical History:  Diagnosis Date   Anemia    Calcium deficiency      Past Surgical History:  Procedure Laterality Date   BARIATRIC SURGERY     CESAREAN SECTION      Social History   Socioeconomic History   Marital status: Divorced    Spouse name: Not on file   Number of children: Not on file   Years of education: Not on file   Highest education level: Not on file  Occupational History   Not on file  Tobacco Use   Smoking status: Never   Smokeless tobacco: Never  Vaping Use   Vaping status: Never Used  Substance and Sexual Activity   Alcohol use: Yes    Comment: occassionally   Drug use: Never   Sexual activity: Yes  Other Topics Concern   Not on file  Social History Narrative   Not on file   Social Drivers of Health   Tobacco Use: Low Risk (10/07/2024)   Patient History    Smoking Tobacco Use: Never    Smokeless Tobacco Use: Never    Passive Exposure: Not on file  Financial Resource Strain: High Risk (12/27/2023)   [facility]   Overall Financial Resource Strain (CARDIA)  Difficulty of Paying Living Expenses: Very hard  Food Insecurity: Food Insecurity Present (12/27/2023)   [facility]   Epic    Within the past 12 months, you worried that your food would run out before you got the money to buy more.: Sometimes true    Within the past 12 months, the food you bought just didn't last and you didn't have money to get more.: Sometimes true  Transportation Needs: Unmet Transportation Needs (12/27/2023)    Received from [facility] - Transportation    In the past 12 months, has lack of transportation kept you from medical appointments or from getting medications?: Yes    Lack of Transportation (Non-Medical): Yes  Physical Activity: Not on file  Stress: Not on file  Social Connections: Not on file  Intimate Partner Violence: Not on file  Depression (PHQ2-9): Low Risk (10/07/2024)   Depression (PHQ2-9)    PHQ-2 Score: 0  Alcohol Screen: Not on file  Housing: High Risk (12/27/2023)   Received from [facility]   Epic    In the last 12 months, was there a time when you were not able to pay the mortgage or rent on time?: Yes    Number of Times Moved in the Last Year: Not on file    At any time in the past 12 months, were you homeless or living in a shelter (including now)?: No  Utilities: Not At Risk (12/27/2023)   Received from [facility] Utilities    Threatened with loss of utilities: No  Health Literacy: Not on file    Family History  Problem Relation Age of Onset   Diabetes Mother    Thyroid  disease Mother    Breast cancer Neg Hx     No current outpatient medications on file. No current facility-administered medications for this visit.  Facility-Administered Medications Ordered in Other Visits:    cyanocobalamin  (VITAMIN B12) injection 1,000 mcg, 1,000 mcg, Intramuscular, Q30 days, Melanee Annah BROCKS, MD, 1,000 mcg at 10/07/24 1106  Physical exam:  Vitals:   10/07/24 1008  BP: 128/85  Pulse: 78  Resp: 18  Temp: 97.9 F (36.6 C)  TempSrc: Tympanic  SpO2: 100%  Weight: 197 lb (89.4 kg)    Physical Exam Cardiovascular:     Rate and Rhythm: Normal rate and regular rhythm.     Heart sounds: Normal heart sounds.  Pulmonary:     Effort: Pulmonary effort is normal.     Breath sounds: Normal breath sounds.  Abdominal:     General: Bowel sounds are normal.  Skin:    General: Skin is warm and  dry.  Neurological:     Mental Status: She is alert and oriented to person, place, and time.      I have personally reviewed labs listed below:    Latest Ref Rng & Units 04/04/2024   10:21 AM  CMP  Glucose 70 - 99 mg/dL 896   BUN 6 - 20 mg/dL 13   Creatinine 9.55 - 1.00 mg/dL 9.38   Sodium 864 - 854 mmol/L 136   Potassium 3.5 - 5.1 mmol/L 3.6   Chloride 98 - 111 mmol/L 107   CO2 22 - 32 mmol/L 22   Calcium 8.9 - 10.3 mg/dL 8.9   Total Protein 6.5 - 8.1 g/dL 7.6   Total Bilirubin 0.0 - 1.2 mg/dL 0.3   Alkaline Phos 38 - 126 U/L 128  AST 15 - 41 U/L 17   ALT 0 - 44 U/L 12       Latest Ref Rng & Units 10/07/2024   10:05 AM  CBC  WBC 4.0 - 10.5 K/uL 7.6   Hemoglobin 12.0 - 15.0 g/dL 87.9   Hematocrit 63.9 - 46.0 % 37.3   Platelets 150 - 400 K/uL 474    I have personally reviewed Radiology images listed below: No images are attached to the encounter.      Assessment and plan- Patient is a 43 y.o. female here for routine follow-up of iron  deficiency anemia  Patient is mildly anemic with a hemoglobin of 11.3 based on labs on 04/04/2024.  Ferritin levels are presently low at 6 with iron  saturation of 7%.  We will proceed with 5 doses of Venofer  at this time.  Discussed recent benefits of IV iron  including all but not limited to possible risk of infusion and anaphylactic reaction.  Patient understands and agrees to proceed as planned.  B12 levels are presently normal at 471 and she does not require any B12 injections.  She can continue with oral B12 1000 mcg daily.  CBC ferritin and iron  studies in 2 4 and 6 months and I will see her back in 6 months.  We will also refer the patient to [facility] given her ongoing iron  deficiency which could be secondary to her gastric bypass surgery but she has not had any GI evaluation at all and she is nearing 43 years of age.     Morna Husband AGNP-C [facility] [phone] 10/07/2024 11:07 AM                 "

## 2024-10-09 ENCOUNTER — Encounter: Payer: Self-pay | Admitting: Hematology and Oncology

## 2025-01-05 ENCOUNTER — Inpatient Hospital Stay

## 2025-01-06 ENCOUNTER — Inpatient Hospital Stay: Admitting: Oncology

## 2025-01-06 ENCOUNTER — Inpatient Hospital Stay
